# Patient Record
Sex: Male | Born: 1970 | Race: White | Hispanic: No | Smoking: Never smoker
Health system: Southern US, Community
[De-identification: ages and names within clinical notes are randomized; demographics above are authoritative.]

## PROBLEM LIST (undated history)

## (undated) DIAGNOSIS — Z87442 Personal history of urinary calculi: Secondary | ICD-10-CM

## (undated) DIAGNOSIS — T753XXA Motion sickness, initial encounter: Secondary | ICD-10-CM

## (undated) DIAGNOSIS — J45909 Unspecified asthma, uncomplicated: Secondary | ICD-10-CM

## (undated) DIAGNOSIS — Z464 Encounter for fitting and adjustment of orthodontic device: Secondary | ICD-10-CM

## (undated) DIAGNOSIS — Z973 Presence of spectacles and contact lenses: Secondary | ICD-10-CM

## (undated) DIAGNOSIS — C801 Malignant (primary) neoplasm, unspecified: Secondary | ICD-10-CM

## (undated) HISTORY — PX: NO PAST SURGERIES: SHX2092

---

## 2008-08-02 ENCOUNTER — Emergency Department: Payer: Self-pay | Admitting: Emergency Medicine

## 2013-12-31 ENCOUNTER — Encounter (HOSPITAL_COMMUNITY): Payer: Self-pay | Admitting: Emergency Medicine

## 2013-12-31 ENCOUNTER — Emergency Department (HOSPITAL_COMMUNITY)
Admission: EM | Admit: 2013-12-31 | Discharge: 2014-01-01 | Disposition: A | Discharge status: Home / Self Care | Payer: 59 | Attending: Emergency Medicine | Admitting: Emergency Medicine

## 2013-12-31 DIAGNOSIS — L255 Unspecified contact dermatitis due to plants, except food: Secondary | ICD-10-CM | POA: Insufficient documentation

## 2013-12-31 DIAGNOSIS — L237 Allergic contact dermatitis due to plants, except food: Secondary | ICD-10-CM

## 2013-12-31 MED ORDER — PREDNISONE 20 MG PO TABS: 40.0000 mg | ORAL_TABLET | Freq: Every day | ORAL | Status: DC

## 2013-12-31 MED ORDER — DEXAMETHASONE SODIUM PHOSPHATE 10 MG/ML IJ SOLN
10.0000 mg | Freq: Once | INTRAMUSCULAR | Status: AC
  Administered 2014-01-01: 10 mg via INTRAMUSCULAR
  Filled 2013-12-31: qty 1

## 2013-12-31 NOTE — ED Provider Notes (Signed)
CSN: [id number]     Arrival date & time 12/31/13  2036 History  This chart was scribed for Antonietta Breach, PA, working with Jasper Riling. Alvino Chapel, MD by Steva Colder, ED Scribe. The patient was seen in room WTR6/WTR6 at 11:47 PM.     Chief Complaint  Patient presents with  . Poison Ivy    The history is provided by the patient. No language interpreter was used.   HPI Comments: Sean Garrett is a 43 y.o. male who presents to the Emergency Department complaining of rash from a possible poison ivy exposure onset yesterday. He states that the rash is all over his face, behind his ear  and his belly button. He states that he was doing yardwork and was exposed to the poison ivy. He states that he previously cut down the poison ivy in his yard a couple of months ago. He states that he knew where it was and that he could not distinguish it from the other plants. Pt denies any other associated symptoms. He states that he has taken 1 benadryl today with no relief. He denies vision changes, vision loss, eye pain, difficulty swallowing, oral lesions, drooling, and fever.  History reviewed. No pertinent past medical history. History reviewed. No pertinent past surgical history. History reviewed. No pertinent family history. History  Substance Use Topics  . Smoking status: Never Smoker   . Smokeless tobacco: Not on file  . Alcohol Use: No    Review of Systems  Eyes: Negative for pain and visual disturbance.  Skin: Positive for rash.  All other systems reviewed and are negative.    Allergies  Review of patient's allergies indicates no known allergies.  Home Medications   Prior to Admission medications   Medication Sig Start Date End Date Taking? Authorizing Provider  predniSONE (DELTASONE) 20 MG tablet Take 2 tablets (40 mg total) by mouth daily. 12/31/13   Antonietta Breach, PA-C   BP 128/84  Pulse 74  Temp(Src) 98.7 F (37.1 C) (Oral)  Resp 20  Ht 5\' 9"  (1.753 m)  Wt 200 lb (90.719 kg)  BMI 29.52  kg/m2  SpO2 96%  Physical Exam  Nursing note and vitals reviewed. Constitutional: He is oriented to person, place, and time. He appears well-developed and well-nourished. No distress.  Nontoxic/nonseptic appearing  HENT:  Head: Normocephalic and atraumatic.  Mouth/Throat: Oropharynx is clear and moist. No oropharyngeal exudate.  Oropharynx clear. Patient tolerating secretions without difficulty. No oral lesions.  Eyes: Conjunctivae and EOM are normal. No scleral icterus.  Neck: Normal range of motion. Neck supple.  No stridor  Pulmonary/Chest: Effort normal. No stridor. No respiratory distress. He has no wheezes.  Musculoskeletal: Normal range of motion.  Neurological: He is alert and oriented to person, place, and time.  Skin: Skin is warm and dry. Rash noted. He is not diaphoretic. No erythema. No pallor.  Maculovesicular, pruritic, erythematous rash appreciated to forehead and temples b/l. No active drainage. No skin peeling.  Psychiatric: He has a normal mood and affect. His behavior is normal.    ED Course  Procedures (including critical care time) DIAGNOSTIC STUDIES: Oxygen Saturation is 96% on room air, adequate by my interpretation.    COORDINATION OF CARE: 11:52 PM-Discussed treatment plan which includes Decadron with pt at bedside and pt agreed to plan.   Labs Review Labs Reviewed - No data to display  Imaging Review No results found.   EKG Interpretation None      MDM   Final diagnoses:  Poison ivy dermatitis    Pt presentation consistant with poison ivy dermatitis. Discussed contagiousness & home care. Treated in ED with decadron. Discharge with recommendation to get OTC Zanfel. Script for PO prednisone provided. Pt afebrile and in NAD prior to discharge. Airway intact without compromise. Patient tolerating secretions without difficulty. Strict return precautions discussed. Patient agreeable to plan with no unaddressed concerns.  I personally performed the  services described in this documentation, which was scribed in my presence. The recorded information has been reviewed and is accurate.   Filed Vitals:   12/31/13 2126  BP: 128/84  Pulse: 74  Temp: 98.7 F (37.1 C)  TempSrc: Oral  Resp: 20  Height: 5\' 9"  (1.753 m)  Weight: 200 lb (90.719 kg)  SpO2: 96%     Antonietta Breach, PA-C 01/04/14 1754

## 2013-12-31 NOTE — Discharge Instructions (Signed)
Recommend prednisone as prescribed and OTC Zanfel wash. You may take Benadryl as needed for itching. Follow up with your primary care doctor. Return if symptoms worsen.  Poison [employer] ivy is a inflammation of the skin (contact dermatitis) caused by touching the allergens on the leaves of the ivy plant following previous exposure to the plant. The rash usually appears 48 hours after exposure. The rash is usually bumps (papules) or blisters (vesicles) in a linear pattern. Depending on your own sensitivity, the rash may simply cause redness and itching, or it may also progress to blisters which may break open. These must be well cared for to prevent secondary bacterial (germ) infection, followed by scarring. Keep any open areas dry, clean, dressed, and covered with an antibacterial ointment if needed. The eyes may also get puffy. The puffiness is worst in the morning and gets better as the day progresses. This dermatitis usually heals without scarring, within 2 to 3 weeks without treatment. HOME CARE INSTRUCTIONS  Thoroughly wash with soap and water as soon as you have been exposed to poison ivy. You have about one half hour to remove the plant resin before it will cause the rash. This washing will destroy the oil or antigen on the skin that is causing, or will cause, the rash. Be sure to wash under your fingernails as any plant resin there will continue to spread the rash. Do not rub skin vigorously when washing affected area. Poison ivy cannot spread if no oil from the plant remains on your body. A rash that has progressed to weeping sores will not spread the rash unless you have not washed thoroughly. It is also important to wash any clothes you have been wearing as these may carry active allergens. The rash will return if you wear the unwashed clothing, even several days later. Avoidance of the plant in the future is the best measure. Poison ivy plant can be recognized by the number of leaves. Generally,  poison ivy has three leaves with flowering branches on a single stem. Diphenhydramine may be purchased over the counter and used as needed for itching. Do not drive with this medication if it makes you drowsy.Ask your caregiver about medication for children. SEEK MEDICAL CARE IF:  Open sores develop.  Redness spreads beyond area of rash.  You notice purulent (pus-like) discharge.  You have increased pain.  Other signs of infection develop (such as fever). Document Released: 07/07/2000 Document Revised: 10/02/2011 Document Reviewed: 05/26/2009 [facility] Patient Information 2014 [place], [place].

## 2013-12-31 NOTE — ED Notes (Signed)
Pt complains of possible poison ivy since yesterday

## 2013-12-31 NOTE — ED Notes (Signed)
Initial Contact - pt A+Ox4, reports was cleaning in the yard "and now i've got poison ivy all over".  Pt reports itching to eyebrows, L side of face/ear and hands. Pt denies pain.  Red, raised areas noted.  Otherwise skin PWD.  Ambulatory with steady gait.  NAD.

## 2014-01-06 NOTE — ED Provider Notes (Signed)
Medical screening examination/treatment/procedure(s) were performed by non-physician practitioner and as supervising physician I was immediately available for consultation/collaboration.   EKG Interpretation None       Sean Garrett. Alvino Chapel, MD 01/06/14 1450

## 2014-01-22 ENCOUNTER — Emergency Department (HOSPITAL_COMMUNITY): Payer: 59

## 2014-01-22 ENCOUNTER — Emergency Department (HOSPITAL_COMMUNITY)
Admission: EM | Admit: 2014-01-22 | Discharge: 2014-01-22 | Disposition: A | Discharge status: Home / Self Care | Payer: 59 | Attending: Emergency Medicine | Admitting: Emergency Medicine

## 2014-01-22 ENCOUNTER — Encounter (HOSPITAL_COMMUNITY): Payer: Self-pay | Admitting: Emergency Medicine

## 2014-01-22 DIAGNOSIS — R1084 Generalized abdominal pain: Secondary | ICD-10-CM | POA: Insufficient documentation

## 2014-01-22 DIAGNOSIS — IMO0002 Reserved for concepts with insufficient information to code with codable children: Secondary | ICD-10-CM | POA: Insufficient documentation

## 2014-01-22 DIAGNOSIS — C642 Malignant neoplasm of left kidney, except renal pelvis: Secondary | ICD-10-CM

## 2014-01-22 DIAGNOSIS — C649 Malignant neoplasm of unspecified kidney, except renal pelvis: Secondary | ICD-10-CM | POA: Insufficient documentation

## 2014-01-22 LAB — COMPREHENSIVE METABOLIC PANEL
ALBUMIN: 4.4 g/dL (ref 3.5–5.2)
ALT: 35 U/L (ref 0–53)
AST: 45 U/L — ABNORMAL HIGH (ref 0–37)
Alkaline Phosphatase: 53 U/L (ref 39–117)
Anion gap: 15 (ref 5–15)
BILIRUBIN TOTAL: 1 mg/dL (ref 0.3–1.2)
BUN: 19 mg/dL (ref 6–23)
CHLORIDE: 100 meq/L (ref 96–112)
CO2: 25 meq/L (ref 19–32)
Calcium: 10.1 mg/dL (ref 8.4–10.5)
Creatinine, Ser: 1.06 mg/dL (ref 0.50–1.35)
GFR calc Af Amer: >90 mL/min (ref >90)
GFR, EST NON AFRICAN AMERICAN: 86 mL/min — AB (ref >90)
Glucose, Bld: 107 mg/dL — ABNORMAL HIGH (ref 70–99)
Potassium: 4 mEq/L (ref 3.7–5.3)
Sodium: 140 mEq/L (ref 137–147)
Total Protein: 8.1 g/dL (ref 6.0–8.3)

## 2014-01-22 LAB — CBC WITH DIFFERENTIAL/PLATELET
Basophils Absolute: 0 10*3/uL (ref 0.0–0.1)
Basophils Relative: 1 % (ref 0–1)
Eosinophils Absolute: 0.1 10*3/uL (ref 0.0–0.7)
Eosinophils Relative: 2 % (ref 0–5)
HEMATOCRIT: 43.4 % (ref 39.0–52.0)
HEMOGLOBIN: 15.7 g/dL (ref 13.0–17.0)
LYMPHS PCT: 49 % — AB (ref 12–46)
Lymphs Abs: 3.3 10*3/uL (ref 0.7–4.0)
MCH: 30.1 pg (ref 26.0–34.0)
MCHC: 36.2 g/dL — AB (ref 30.0–36.0)
MCV: 83.3 fL (ref 78.0–100.0)
MONO ABS: 0.4 10*3/uL (ref 0.1–1.0)
Monocytes Relative: 6 % (ref 3–12)
NEUTROS ABS: 2.9 10*3/uL (ref 1.7–7.7)
NEUTROS PCT: 42 % — AB (ref 43–77)
Platelets: 187 10*3/uL (ref 150–400)
RBC: 5.21 MIL/uL (ref 4.22–5.81)
RDW: 12.2 % (ref 11.5–15.5)
WBC: 6.7 10*3/uL (ref 4.0–10.5)

## 2014-01-22 LAB — URINALYSIS, ROUTINE W REFLEX MICROSCOPIC
Bilirubin Urine: NEGATIVE
Glucose, UA: NEGATIVE mg/dL
HGB URINE DIPSTICK: NEGATIVE
Ketones, ur: NEGATIVE mg/dL
LEUKOCYTES UA: NEGATIVE
Nitrite: NEGATIVE
PH: 7.5 (ref 5.0–8.0)
PROTEIN: NEGATIVE mg/dL
SPECIFIC GRAVITY, URINE: 1.022 (ref 1.005–1.030)
Urobilinogen, UA: 2 mg/dL — ABNORMAL HIGH (ref 0.0–1.0)

## 2014-01-22 LAB — LIPASE, BLOOD: Lipase: 13 U/L (ref 11–59)

## 2014-01-22 LAB — I-STAT TROPONIN, ED: Troponin i, poc: 0.01 ng/mL (ref 0.00–0.08)

## 2014-01-22 MED ORDER — ONDANSETRON HCL 4 MG/2ML IJ SOLN
4.0000 mg | Freq: Once | INTRAMUSCULAR | Status: AC
  Administered 2014-01-22: 4 mg via INTRAVENOUS
  Filled 2014-01-22: qty 2

## 2014-01-22 MED ORDER — MORPHINE SULFATE 4 MG/ML IJ SOLN
4.0000 mg | Freq: Once | INTRAMUSCULAR | Status: AC
  Administered 2014-01-22: 4 mg via INTRAVENOUS
  Filled 2014-01-22: qty 1

## 2014-01-22 MED ORDER — IOHEXOL 300 MG/ML  SOLN
50.0000 mL | Freq: Once | INTRAMUSCULAR | Status: AC | PRN
  Administered 2014-01-22: 50 mL via ORAL

## 2014-01-22 MED ORDER — IOHEXOL 300 MG/ML  SOLN
100.0000 mL | Freq: Once | INTRAMUSCULAR | Status: AC | PRN
  Administered 2014-01-22: 100 mL via INTRAVENOUS

## 2014-01-22 NOTE — Discharge Instructions (Signed)
Abdominal Pain Many things can cause abdominal pain. Usually, abdominal pain is not caused by a disease and will improve without treatment. It can often be observed and treated at home. Your health care provider will do a physical exam and possibly order blood tests and X-rays to help determine the seriousness of your pain. However, in many cases, more time must pass before a clear cause of the pain can be found. Before that point, your health care provider may not know if you need more testing or further treatment. HOME CARE INSTRUCTIONS  Monitor your abdominal pain for any changes. The following actions may help to alleviate any discomfort you are experiencing:  Only take over-the-counter or prescription medicines as directed by your health care provider.  Do not take laxatives unless directed to do so by your health care provider.  Try a clear liquid diet (broth, tea, or water) as directed by your health care provider. Slowly move to a bland diet as tolerated. SEEK MEDICAL CARE IF:  You have unexplained abdominal pain.  You have abdominal pain associated with nausea or diarrhea.  You have pain when you urinate or have a bowel movement.  You experience abdominal pain that wakes you in the night.  You have abdominal pain that is worsened or improved by eating food.  You have abdominal pain that is worsened with eating fatty foods.  You have a fever. SEEK IMMEDIATE MEDICAL CARE IF:   Your pain does not go away within 2 hours.  You keep throwing up (vomiting).  Your pain is felt only in portions of the abdomen, such as the right side or the left lower portion of the abdomen.  You pass bloody or black tarry stools. MAKE SURE YOU:  Understand these instructions.   Will watch your condition.   Will get help right away if you are not doing well or get worse.  Document Released: 04/19/2005 Document Revised: 07/15/2013 Document Reviewed: 03/19/2013 [facility] Patient Information  2015 [place], [place]. This information is not intended to replace advice given to you by your health care provider. Make sure you discuss any questions you have with your health care provider.  Renal Cell Cancer Renal cell cancer (kidney cancer) is a disease in which cancer cells form in the linings of tubules of the kidney. A cancer is an uncontrolled growth of cells and can occur anywhere in the body. Your kidneys are the organs which filter your blood and keep it clean by getting rid of waste products from your body in your urine. Urine passes from the kidneys into the bladder through long tubes called ureters. The bladder stores the urine until it is passed from the body through the tube which drains the bladder to the outside (urethra). SYMPTOMS  Early in the disease there may be no problems but as the disease worsens some of the problems seen are:  Blood in the urine.  Belly (abdominal) pain.  Decreased red blood cells (anemia).  A swelling in the belly.  Loss of appetite and weight loss.  Fever from unknown causes. DIAGNOSIS   Your caregiver will do a physical exam. This means they check you over.  Laboratory work may show problems (abnormalities) in the urine.  Plain X-rays and some specialized x-rays may be done. Some of these may include a CT scan. Sometimes an IVP (intravenous pyelogram) is done. In this test a dye is injected into a vein and pictures are then taken of the kidneys. The dye travels to the  inside of the kidneys, ureters and bladder. Let your caregivers know if you are allergic to iodine or have had a past reaction to dyes used in X-rays. Other specialized x-rays sometimes taken are the MRI (magnetic resonance imaging) and PET scan (positron emission technology).  Angiography is sometimes done in which a dye is put into an artery leading to the kidney so the vessels surrounding the tumor or growth can be studied.  Your caregiver will explain the value of the  various testing to you and why it is necessary and helpful. If some of the above tests show a tumor or growth, sometimes a needle biopsy is done to confirm this and find out what the growth is made of. A fine needle aspiration (FNA) is used to remove a sliver of tissue from the kidney. This is done by sticking a needle through your skin and into the kidney. A specialist in looking at cells under the microscope (pathologist) then looks at the biopsy to determine what is wrong. The pathologist will check for cancer cells. Usually the previous tests mentioned have already given your surgeon enough information to know if an operation is needed. TREATMENT  You will want to discuss treatment choices with your caregivers and see what the best treatment for you is. This will depend on various factors including your age, other health problems and what stage your disease is in. All of this will play a part in your outcome. Some of the treatment choices are:  Surgery is the main treatment and chances of surviving without this are uncommon. Usually the entire kidney is removed if this is possible. This is called a radical nephrectomy. The surgeon removes your kidney, the small gland on top of the kidney (adrenal gland) and the fat surrounding the kidney. You have another adrenal gland on the other side so removing one is not a problem. Sometimes a partial nephrectomy is done in people with one kidney or people with cancer on both sides. This may help to avoid use of an artificial kidney (dialysis) as only part of a kidney is needed to filter your blood.  Arterial embolization is another treatment. With this treatment a small catheter is threaded from your groin up into your kidney and material is injected into the artery supplying the tumor in the kidney. Without blood supply, the tumor dies off. Sometimes this procedure is used before kidney removal to cut down on blood loss.  Radiation therapy or x-ray therapy can be  used if your health is poor and will not allow surgery. Some of the problems with radiation include fatigue, nausea, vomiting, and damage to skin and surrounding tissues.  Chemotherapy may be used. This is a treatment which uses cancer killing medications to fight the cancer. The side effects depend on the medications used. Some side effects may include nausea, vomiting, loss of weight, loss of appetite, hair loss and other problems. Your caregiver can usually give you medications to overcome most of the problems.  There are many other forms of treatment your caregivers can discuss with you. Together you can determine which treatment will be best for you.  The more you know when dealing with these problems, the more comfortable you will be. Talk over your treatment over with your loved ones. Get a second opinion if you feel it will be of help. Often your surgeon and other caregivers may recommend this for your own comfort or peace of mind. Document Released: 05/20/2004 Document Revised: 10/02/2011 Document Reviewed:  04/30/2008 [employer] Patient Information 2015 [place], [place]. This information is not intended to replace advice given to you by your health care provider. Make sure you discuss any questions you have with your health care provider.

## 2014-01-22 NOTE — ED Provider Notes (Signed)
Medical screening examination/treatment/procedure(s) were conducted as a shared visit with non-physician practitioner(s) and myself.  I personally evaluated the patient during the encounter.   EKG Interpretation   Date/Time:  Thursday January 22 2014 17:55:46 EDT Ventricular Rate:  72 PR Interval:    QRS Duration: 96 QT Interval:  395 QTC Calculation: 432 R Axis:   86 Text Interpretation:  Sinus rhythm baseline artifact Confirmed by  Alvino Chapel  MD, Ilda Laskin [phone]) on 01/22/2014 5:58:43 PM     Patient with gastric abdominal pain tenderness. CT scan showed gallbladder sludge. Patient does not want ultrasound and pain is improved. Incidentally renal mass was found discussed with urology, will followup the patient.  Jasper Riling. Alvino Chapel, MD 01/22/14 2355

## 2014-01-22 NOTE — ED Provider Notes (Signed)
CSN: [id number]     Arrival date & time 01/22/14  1723 History   First MD Initiated Contact with Patient 01/22/14 1743     Chief Complaint  Patient presents with  . Abdominal Pain     (Consider location/radiation/quality/duration/timing/severity/associated sxs/prior Treatment) HPI Comments: Patient presents emergency department with chief complaint of epigastric abdominal pain that started 40 minutes ago. He denies any associated fevers, chills, nausea, vomiting, or diarrhea. States the pain is severe. It does not radiate. He reports some associated shortness of breath, but no chest pain. He has not taken anything to alleviate his symptoms. He has never felt anything like this before. States pain is worsened with lying down. Currently rates pain as 7/10.  He has a history of kidney stones, but states this feel different.  The history is provided by the patient. No language interpreter was used.    History reviewed. No pertinent past medical history. History reviewed. No pertinent past surgical history. No family history on file. History  Substance Use Topics  . Smoking status: Never Smoker   . Smokeless tobacco: Not on file  . Alcohol Use: No    Review of Systems  All other systems reviewed and are negative.     Allergies  Apple  Home Medications   Prior to Admission medications   Medication Sig Start Date End Date Taking? Authorizing Provider  predniSONE (DELTASONE) 20 MG tablet Take 2 tablets (40 mg total) by mouth daily. 12/31/13   Antonietta Breach, PA-C   BP 139/106  Pulse 61  Temp(Src) 97.7 F (36.5 C) (Oral)  Resp 24  SpO2 100% Physical Exam  Nursing note and vitals reviewed. Constitutional: He is oriented to person, place, and time. He appears well-developed and well-nourished.  HENT:  Head: Normocephalic and atraumatic.  Eyes: Conjunctivae and EOM are normal. Pupils are equal, round, and reactive to light. Right eye exhibits no discharge. Left eye exhibits no  discharge. No scleral icterus.  Neck: Normal range of motion. Neck supple. No JVD present.  Cardiovascular: Normal rate, regular rhythm and normal heart sounds.  Exam reveals no gallop and no friction rub.   No murmur heard. Pulmonary/Chest: Effort normal and breath sounds normal. No respiratory distress. He has no wheezes. He has no rales. He exhibits no tenderness.  Abdominal: Soft. He exhibits no distension and no mass. There is no tenderness. There is no rebound and no guarding.  No focal abdominal tenderness, no RLQ tenderness or pain at McBurney's point, no RUQ tenderness or Murphy's sign, no left-sided abdominal tenderness, no fluid wave, or signs of peritonitis   Musculoskeletal: Normal range of motion. He exhibits no edema and no tenderness.  Neurological: He is alert and oriented to person, place, and time.  Skin: Skin is warm and dry.  Psychiatric: He has a normal mood and affect. His behavior is normal. Judgment and thought content normal.    ED Course  Procedures (including critical care time) Results for orders placed during the hospital encounter of 01/22/14  CBC WITH DIFFERENTIAL      Result Value Ref Range   WBC 6.7  4.0 - 10.5 K/uL   RBC 5.21  4.22 - 5.81 MIL/uL   Hemoglobin 15.7  13.0 - 17.0 g/dL   HCT 43.4  39.0 - 52.0 %   MCV 83.3  78.0 - 100.0 fL   MCH 30.1  26.0 - 34.0 pg   MCHC 36.2 (*) 30.0 - 36.0 g/dL   RDW 12.2  11.5 - 15.5 %  Platelets 187  150 - 400 K/uL   Neutrophils Relative % 42 (*) 43 - 77 %   Neutro Abs 2.9  1.7 - 7.7 K/uL   Lymphocytes Relative 49 (*) 12 - 46 %   Lymphs Abs 3.3  0.7 - 4.0 K/uL   Monocytes Relative 6  3 - 12 %   Monocytes Absolute 0.4  0.1 - 1.0 K/uL   Eosinophils Relative 2  0 - 5 %   Eosinophils Absolute 0.1  0.0 - 0.7 K/uL   Basophils Relative 1  0 - 1 %   Basophils Absolute 0.0  0.0 - 0.1 K/uL  COMPREHENSIVE METABOLIC PANEL      Result Value Ref Range   Sodium 140  137 - 147 mEq/L   Potassium 4.0  3.7 - 5.3 mEq/L    Chloride 100  96 - 112 mEq/L   CO2 25  19 - 32 mEq/L   Glucose, Bld 107 (*) 70 - 99 mg/dL   BUN 19  6 - 23 mg/dL   Creatinine, Ser 1.06  0.50 - 1.35 mg/dL   Calcium 10.1  8.4 - 10.5 mg/dL   Total Protein 8.1  6.0 - 8.3 g/dL   Albumin 4.4  3.5 - 5.2 g/dL   AST 45 (*) 0 - 37 U/L   ALT 35  0 - 53 U/L   Alkaline Phosphatase 53  39 - 117 U/L   Total Bilirubin 1.0  0.3 - 1.2 mg/dL   GFR calc non Af Amer 86 (*) >90 mL/min   GFR calc Af Amer >90  >90 mL/min   Anion gap 15  5 - 15  LIPASE, BLOOD      Result Value Ref Range   Lipase 13  11 - 59 U/L  URINALYSIS, ROUTINE W REFLEX MICROSCOPIC      Result Value Ref Range   Color, Urine YELLOW  YELLOW   APPearance CLEAR  CLEAR   Specific Gravity, Urine 1.022  1.005 - 1.030   pH 7.5  5.0 - 8.0   Glucose, UA NEGATIVE  NEGATIVE mg/dL   Hgb urine dipstick NEGATIVE  NEGATIVE   Bilirubin Urine NEGATIVE  NEGATIVE   Ketones, ur NEGATIVE  NEGATIVE mg/dL   Protein, ur NEGATIVE  NEGATIVE mg/dL   Urobilinogen, UA 2.0 (*) 0.0 - 1.0 mg/dL   Nitrite NEGATIVE  NEGATIVE   Leukocytes, UA NEGATIVE  NEGATIVE  I-STAT TROPOININ, ED      Result Value Ref Range   Troponin i, poc 0.01  0.00 - 0.08 ng/mL   Comment 3            Ct Abdomen Pelvis W Contrast  01/22/2014   CLINICAL DATA:  Acute onset right upper quadrant pain.  EXAM: CT ABDOMEN AND PELVIS WITH CONTRAST  TECHNIQUE: Multidetector CT imaging of the abdomen and pelvis was performed using the standard protocol following bolus administration of intravenous contrast.  CONTRAST:  71mL OMNIPAQUE IOHEXOL 300 MG/ML SOLN, 167mL OMNIPAQUE IOHEXOL 300 MG/ML SOLN  COMPARISON:  None.  FINDINGS: LUNG BASES: Included view of the lung bases are clear. Visualized heart and pericardium are unremarkable.  SOLID ORGANS: The liver, pancreas and adrenal glands are unremarkable. Splenic granuloma, spleen is otherwise unremarkable. Pain apparent layering density in the gallbladder could reflect sludge or artifact from contrast in the  stomach.  GASTROINTESTINAL TRACT: The stomach, small and large bowel are normal in course and caliber without inflammatory changes. Normal appendix.  KIDNEYS/ URINARY TRACT: Kidneys are orthotopic, demonstrating symmetric  enhancement. Solid 2.9 cm left interpolar exophytic renal mass, with early washout. Too small to characterize hypodensity in left kidney. Delayed imaging through the kidneys demonstrates symmetric prompt excretion to the proximal urinary collecting system. Urinary bladder is partially distended and unremarkable.  PERITONEUM/RETROPERITONEUM: No intraperitoneal free fluid nor free air. Aortoiliac vessels are normal in course and caliber. No lymphadenopathy by CT size criteria. Internal reproductive organs are unremarkable.  SOFT TISSUE/OSSEOUS STRUCTURES: Nonsuspicious. Small fat containing umbilical hernia.  IMPRESSION: Possible gallbladder sludge (or artifact) which would be better assessed on sonogram as clinically indicated. No acute intra-abdominal or pelvic process.  Solid hyperenhancing 2.9 cm left renal mass highly concerning for primary renal cell carcinoma. Consider MRI versus biopsy, versus surgical consultation.  Findings discussed with and reconfirmed by Montine Circle, PA on7/2/2015at8:04 PM.   Electronically Signed   By: Elon Alas   On: 01/22/2014 20:05   Dg Chest Portable 1 View  01/22/2014   CLINICAL DATA:  Pain in xiphoid process.  EXAM: PORTABLE CHEST - 1 VIEW  COMPARISON:  None.  FINDINGS: The heart size and mediastinal contours are within normal limits. Both lungs are clear. The visualized skeletal structures are unremarkable.  IMPRESSION: No active disease.   Electronically Signed   By: Kerby Moors M.D.   On: 01/22/2014 18:22      EKG Interpretation   Date/Time:  Thursday January 22 2014 17:55:46 EDT Ventricular Rate:  72 PR Interval:    QRS Duration: 96 QT Interval:  395 QTC Calculation: 432 R Axis:   86 Text Interpretation:  Sinus rhythm baseline  artifact Confirmed by  Alvino Chapel  MD, Ovid Curd [phone]) on 01/22/2014 5:58:43 PM      MDM   Final diagnoses:  Generalized abdominal pain  Renal malignant tumor, left    Patient with right upper quadrant and started 40 minutes prior to arrival. Symptoms rapidly resolved. Patient has been pain-free almost his entire ED stay. Labs are reassuring. CT scan remarkable for possible renal cell carcinoma, however I do not suspect that this is because of the patient's symptoms. Discussed patient with urology, who will see the patient on Monday morning for evaluation and treatment of the renal cell carcinoma. No other testing at this time per urology. Patient seen by and discussed with Dr. Alvino Chapel, who agrees with the plan.    Montine Circle, PA-C 01/22/14 2102

## 2014-01-22 NOTE — ED Notes (Addendum)
PA at bedside informing pt. Of high suspicion of Renal cell carcinoma. Pt family visibly upset from the news.

## 2014-01-22 NOTE — ED Notes (Signed)
Accidental validation of 78% O2 sat. Pt maintaining in the 90's with good wave form.

## 2014-01-22 NOTE — Progress Notes (Signed)
  CARE MANAGEMENT ED NOTE 01/22/2014  Patient:  Purifoy,Terek   Account Number:  [id number]  Date Initiated:  01/22/2014  Documentation initiated by:  Livia Snellen  Subjective/Objective Assessment:   Patient presents to ED with right upper quadrant pain     Subjective/Objective Assessment Detail:     Action/Plan:   Action/Plan Detail:   Anticipated DC Date:       Status Recommendation to Physician:   Result of Recommendation:    Other ED [place]  Other  PCP issues    Choice offered to / List presented to:            Status of service:  Completed, signed off  ED Comments:   ED Comments Detail:  EDCM spoke to patient at bedside.  Patient confirms he does not have a pcp with [place] [employer] care insurance.  [facility] provided patient with a list of pcps who accept  [employer] within a ten mile radius of patient's zip code [phone].  Patient thankful for resources.  No further EDCM needs at this time.

## 2014-01-22 NOTE — ED Notes (Signed)
Patient transported to CT 

## 2014-01-22 NOTE — ED Notes (Signed)
Pt c/o RUQ pain that started sudden about 40 mins ago. Pt denies n/v/d.

## 2014-02-03 ENCOUNTER — Ambulatory Visit (HOSPITAL_COMMUNITY)
Admission: RE | Admit: 2014-02-03 | Discharge: 2014-02-03 | Disposition: A | Discharge status: Home / Self Care | Payer: 59 | Source: Ambulatory Visit | Attending: Urology | Admitting: Urology

## 2014-02-03 ENCOUNTER — Other Ambulatory Visit: Payer: Self-pay | Admitting: Urology

## 2014-02-03 DIAGNOSIS — N2889 Other specified disorders of kidney and ureter: Secondary | ICD-10-CM

## 2014-02-03 DIAGNOSIS — N289 Disorder of kidney and ureter, unspecified: Secondary | ICD-10-CM | POA: Insufficient documentation

## 2014-02-05 ENCOUNTER — Ambulatory Visit (HOSPITAL_COMMUNITY)
Admission: RE | Admit: 2014-02-05 | Discharge: 2014-02-05 | Disposition: A | Discharge status: Home / Self Care | Payer: 59 | Source: Ambulatory Visit | Attending: Urology | Admitting: Urology

## 2014-02-05 ENCOUNTER — Other Ambulatory Visit (HOSPITAL_COMMUNITY): Payer: Self-pay | Admitting: Urology

## 2014-02-05 DIAGNOSIS — N2889 Other specified disorders of kidney and ureter: Secondary | ICD-10-CM

## 2014-02-05 DIAGNOSIS — N281 Cyst of kidney, acquired: Secondary | ICD-10-CM | POA: Insufficient documentation

## 2014-02-05 DIAGNOSIS — N289 Disorder of kidney and ureter, unspecified: Secondary | ICD-10-CM | POA: Insufficient documentation

## 2014-02-05 DIAGNOSIS — K838 Other specified diseases of biliary tract: Secondary | ICD-10-CM | POA: Insufficient documentation

## 2014-02-05 DIAGNOSIS — K829 Disease of gallbladder, unspecified: Secondary | ICD-10-CM | POA: Insufficient documentation

## 2014-02-05 MED ORDER — GADOBENATE DIMEGLUMINE 529 MG/ML IV SOLN
20.0000 mL | Freq: Once | INTRAVENOUS | Status: AC | PRN
  Administered 2014-02-05: 20 mL via INTRAVENOUS

## 2014-02-06 ENCOUNTER — Other Ambulatory Visit: Payer: Self-pay | Admitting: Urology

## 2014-02-12 ENCOUNTER — Encounter (HOSPITAL_COMMUNITY): Payer: Self-pay | Admitting: Pharmacy Technician

## 2014-02-16 NOTE — Progress Notes (Addendum)
Chest x-ray 02/04/14 on EP[facility], EKG 01/23/14 on EP[facility] - will repeat, CBC with diff and CMET 01/22/14 on EPIC

## 2014-02-16 NOTE — Patient Instructions (Addendum)
[place]  02/16/2014   Your procedure is scheduled on: Thursday 02/26/14  Report to [facility] at 09:00 AM.  Call this number if you have problems the morning of surgery [phone]   Remember: please follow bowel prep instructions    Do not eat food or drink liquids After Midnight.   Do not wear jewelry, make-up or nail polish.  Do not wear lotions, powders, or perfumes. You may wear deodorant.  Do not shave 48 hours prior to surgery. Men may shave face and neck.  Do not bring valuables to the hospital.  Contacts, dentures or bridgework may not be worn into surgery.  Leave suitcase in the car. After surgery it may be brought to your room.  For patients admitted to the hospital, checkout time is 11:00 AM the day of discharge.  Paulette Blanch, RN  pre op nurse call if needed [phone]    [facility] - Preparing for Surgery Before surgery, you can play an important role.  Because skin is not sterile, your skin needs to be as free of germs as possible.  You can reduce the number of germs on your skin by washing with CHG (chlorahexidine gluconate) soap before surgery.  CHG is an antiseptic cleaner which kills germs and bonds with the skin to continue killing germs even after washing. Please DO NOT use if you have an allergy to CHG or antibacterial soaps.  If your skin becomes reddened/irritated stop using the CHG and inform your nurse when you arrive at Short Stay. Do not shave (including legs and underarms) for at least 48 hours prior to the first CHG shower.  You may shave your face/neck. Please follow these instructions carefully:  1.  Shower with CHG Soap the night before surgery and the  morning of Surgery.  2.  If you choose to wash your hair, wash your hair first as usual with your  normal  shampoo.  3.  After you shampoo, rinse your hair and body thoroughly to remove the  shampoo.                            4.  Use CHG as you would any other liquid soap.   You can apply chg directly  to the skin and wash                       Gently with a scrungie or clean washcloth.  5.  Apply the CHG Soap to your body ONLY FROM THE NECK DOWN.   Do not use on face/ open                           Wound or open sores. Avoid contact with eyes, ears mouth and genitals (private parts).                       Wash face,  Genitals (private parts) with your normal soap.             6.  Wash thoroughly, paying special attention to the area where your surgery  will be performed.  7.  Thoroughly rinse your body with warm water from the neck down.  8.  DO NOT shower/wash with your normal soap after using and rinsing off  the CHG Soap.  9.  Pat yourself dry with a clean towel.            10.  Wear clean pajamas.            11.  Place clean sheets on your bed the night of your first shower and do not  sleep with pets. Day of Surgery : Do not apply any lotions/deodorants the morning of surgery.  Please wear clean clothes to the hospital/surgery center.  FAILURE TO FOLLOW THESE INSTRUCTIONS MAY RESULT IN THE CANCELLATION OF YOUR SURGERY PATIENT SIGNATURE_________________________________  NURSE SIGNATURE__________________________________  ________________________________________________________________________   Adam Phenix  An incentive spirometer is a tool that can help keep your lungs clear and active. This tool measures how well you are filling your lungs with each breath. Taking long deep breaths may help reverse or decrease the chance of developing breathing (pulmonary) problems (especially infection) following:  A long period of time when you are unable to move or be active. BEFORE THE PROCEDURE   If the spirometer includes an indicator to show your best effort, your nurse or respiratory therapist will set it to a desired goal.  If possible, sit up straight or lean slightly forward. Try not to slouch.  Hold the incentive spirometer in an  upright position. INSTRUCTIONS FOR USE  1. Sit on the edge of your bed if possible, or sit up as far as you can in bed or on a chair. 2. Hold the incentive spirometer in an upright position. 3. Breathe out normally. 4. Place the mouthpiece in your mouth and seal your lips tightly around it. 5. Breathe in slowly and as deeply as possible, raising the piston or the ball toward the top of the column. 6. Hold your breath for 3-5 seconds or for as long as possible. Allow the piston or ball to fall to the bottom of the column. 7. Remove the mouthpiece from your mouth and breathe out normally. 8. Rest for a few seconds and repeat Steps 1 through 7 at least 10 times every 1-2 hours when you are awake. Take your time and take a few normal breaths between deep breaths. 9. The spirometer may include an indicator to show your best effort. Use the indicator as a goal to work toward during each repetition. 10. After each set of 10 deep breaths, practice coughing to be sure your lungs are clear. If you have an incision (the cut made at the time of surgery), support your incision when coughing by placing a pillow or rolled up towels firmly against it. Once you are able to get out of bed, walk around indoors and cough well. You may stop using the incentive spirometer when instructed by your caregiver.  RISKS AND COMPLICATIONS  Take your time so you do not get dizzy or light-headed.  If you are in pain, you may need to take or ask for pain medication before doing incentive spirometry. It is harder to take a deep breath if you are having pain. AFTER USE  Rest and breathe slowly and easily.  It can be helpful to keep track of a log of your progress. Your caregiver can provide you with a simple table to help with this. If you are using the spirometer at home, follow these instructions: [place] IF:   You are having difficultly using the spirometer.  You have trouble using the spirometer as often as  instructed.  Your pain medication is not giving enough relief while using the spirometer.  You  develop fever of 100.5 F (38.1 C) or higher. SEEK IMMEDIATE MEDICAL CARE IF:   You cough up bloody sputum that had not been present before.  You develop fever of 102 F (38.9 C) or greater.  You develop worsening pain at or near the incision site. MAKE SURE YOU:   Understand these instructions.  Will watch your condition.  Will get help right away if you are not doing well or get worse. Document Released: 11/20/2006 Document Revised: 10/02/2011 Document Reviewed: 01/21/2007 ExitCare Patient Information 2014 ExitCare, [place].   ________________________________________________________________________  WHAT IS A BLOOD TRANSFUSION? Blood Transfusion Information  A transfusion is the replacement of blood or some of its parts. Blood is made up of multiple cells which provide different functions.  Red blood cells carry oxygen and are used for blood loss replacement.  White blood cells fight against infection.  Platelets control bleeding.  Plasma helps clot blood.  Other blood products are available for specialized needs, such as hemophilia or other clotting disorders. BEFORE THE TRANSFUSION  Who gives blood for transfusions?   Healthy volunteers who are fully evaluated to make sure their blood is safe. This is blood bank blood. Transfusion therapy is the safest it has ever been in the practice of medicine. Before blood is taken from a donor, a complete history is taken to make sure that person has no history of diseases nor engages in risky social behavior (examples are intravenous drug use or sexual activity with multiple partners). The donor's travel history is screened to minimize risk of transmitting infections, such as malaria. The donated blood is tested for signs of infectious diseases, such as HIV and hepatitis. The blood is then tested to be sure it is compatible with you in  order to minimize the chance of a transfusion reaction. If you or a relative donates blood, this is often done in anticipation of surgery and is not appropriate for emergency situations. It takes many days to process the donated blood. RISKS AND COMPLICATIONS Although transfusion therapy is very safe and saves many lives, the main dangers of transfusion include:   Getting an infectious disease.  Developing a transfusion reaction. This is an allergic reaction to something in the blood you were given. Every precaution is taken to prevent this. The decision to have a blood transfusion has been considered carefully by your caregiver before blood is given. Blood is not given unless the benefits outweigh the risks. AFTER THE TRANSFUSION  Right after receiving a blood transfusion, you will usually feel much better and more energetic. This is especially true if your red blood cells have gotten low (anemic). The transfusion raises the level of the red blood cells which carry oxygen, and this usually causes an energy increase.  The nurse administering the transfusion will monitor you carefully for complications. HOME CARE INSTRUCTIONS  No special instructions are needed after a transfusion. You may find your energy is better. Speak with your caregiver about any limitations on activity for underlying diseases you may have. SEEK MEDICAL CARE IF:   Your condition is not improving after your transfusion.  You develop redness or irritation at the intravenous (IV) site. SEEK IMMEDIATE MEDICAL CARE IF:  Any of the following symptoms occur over the next 12 hours:  Shaking chills.  You have a temperature by mouth above 102 F (38.9 C), not controlled by medicine.  Chest, back, or muscle pain.  People around you feel you are not acting correctly or are confused.  Shortness of breath  or difficulty breathing.  Dizziness and fainting.  You get a rash or develop hives.  You have a decrease in urine  output.  Your urine turns a dark color or changes to pink, red, or brown. Any of the following symptoms occur over the next 10 days:  You have a temperature by mouth above 102 F (38.9 C), not controlled by medicine.  Shortness of breath.  Weakness after normal activity.  The white part of the eye turns yellow (jaundice).  You have a decrease in the amount of urine or are urinating less often.  Your urine turns a dark color or changes to pink, red, or brown. Document Released: 07/07/2000 Document Revised: 10/02/2011 Document Reviewed: 02/24/2008 [facility] Patient Information 2014 [place], [place].  _______________________________________________________________________

## 2014-02-17 ENCOUNTER — Encounter (HOSPITAL_COMMUNITY)
Admission: RE | Admit: 2014-02-17 | Discharge: 2014-02-17 | Disposition: A | Discharge status: Home / Self Care | Payer: 59 | Source: Ambulatory Visit | Attending: Urology | Admitting: Urology

## 2014-02-17 ENCOUNTER — Encounter (HOSPITAL_COMMUNITY): Payer: Self-pay

## 2014-02-17 DIAGNOSIS — Z01810 Encounter for preprocedural cardiovascular examination: Secondary | ICD-10-CM | POA: Insufficient documentation

## 2014-02-17 HISTORY — DX: Personal history of urinary calculi: Z87.442

## 2014-02-17 HISTORY — DX: Unspecified asthma, uncomplicated: J45.909

## 2014-02-17 HISTORY — DX: Malignant (primary) neoplasm, unspecified: C80.1

## 2014-02-25 NOTE — Progress Notes (Signed)
Surgery time change from 1100 on 02/26/14 to 0715 on 02/26/14. Called pt and left message for pt to arrive at short stay at 05:15am.

## 2014-02-25 NOTE — Progress Notes (Addendum)
0945  Call back from [place] confirming that he received the surgery time change and will arrive at [place] and go to Short Stay.

## 2014-02-25 NOTE — H&P (Signed)
Chief Complaint Left renal mass   Reason For Visit Reason for consult: To discuss minimally invasive nephron sparing surgery for his left renal mass.  Physician requesting consult: Dr. Irine Seal  PCP:   History of Present Illness Sean Garrett is a 43 year old gentleman who developed acute onset of severe epigastric abdominal pain about 2 weeks ago prompting him to present to the [facility] ED. He underwent a CT scan for evaluation and was noted to have gallbladder sludge but no clear source for his pain which has now resolved. Incidentally, he was noted to have a hyperdense left renal mass that appeared to washout on delayed imaging although his CT scan was performed only with contrast. His mass was 2.9 cm and located in the lateral interpolar region of the left kidney. It was mostly exophytic. 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 He has no contralateral renal masses, adrenal masses, regional lymphadenopathy, renal vein/IVC involvement, or other evidence of metastatic disease to the abdomen.    He denies hematuria or flank pain, denies weight loss, and has no family history of kidney cancer.     He has a history of urolithiasis and has reportedly passed 6 stones in the past. He has no stones noted on his current CT scan.    His baseline Cr is 1.06 with an eGFR of > 60 ml/min. Hgb was 15.7, LFTs and alk phos are normal except for mild elevation of AST which was 45 (0-37 normal). His UA was normal.     Past Medical History Problems  1. History of asthma (V12.69)  Surgical History Problems  1. History of Dental Surgery 2. History of Surgery Of Male Genitalia Vasectomy  Current Meds 1. No Reported Medications Recorded  Allergies Medication  1. No Known Drug Allergies  Family History Problems  1. Family history of cerebral palsy (V17.2) : Son 2. Family history of malignant neoplasm (V16.9)  Social History Problems    Denied: History of Alcohol use   Never a  smoker   Occupation   Automotive engineer  Review of Systems Genitourinary, constitutional, skin, eye, otolaryngeal, hematologic/lymphatic, cardiovascular, pulmonary, endocrine, musculoskeletal, gastrointestinal, neurological and psychiatric system(s) were reviewed and pertinent findings if present are noted.    Vitals Vital Signs [Data Includes: Last 1 Day]  Recorded: [id number] 04:49PM  Blood Pressure: 166 / 94 Heart Rate: 68  Physical Exam Constitutional: Well nourished and well developed . No acute distress.  ENT:. The ears and nose are normal in appearance.  Neck: The appearance of the neck is normal and no neck mass is present.  Pulmonary: No respiratory distress, normal respiratory rhythm and effort and clear bilateral breath sounds.  Cardiovascular: Heart rate and rhythm are normal . No peripheral edema.  Abdomen: The abdomen is soft and nontender. No masses are palpated. No CVA tenderness. No hernias are palpable. No hepatosplenomegaly noted.  Lymphatics: The supraclavicular, femoral and inguinal nodes are not enlarged or tender.  Skin: Normal skin turgor, no visible rash and no visible skin lesions.  Neuro/Psych:. Mood and affect are appropriate.    Results/Data Selected Results  UA With REFLEX [id number] 04:43PM Raynelle Bring  SPECIMEN TYPE: CLEAN CATCH   Test Name Result Flag Reference  COLOR YELLOW  YELLOW  APPEARANCE CLEAR  CLEAR  SPECIFIC GRAVITY 1.010  1.005-1.030  pH 8.5 H 5.0-8.0  GLUCOSE NEG mg/dL  NEG  BILIRUBIN NEG  NEG  KETONE NEG mg/dL  NEG  BLOOD NEG  NEG  PROTEIN NEG  mg/dL  NEG  UROBILINOGEN 1 mg/dL  0.0-1.0  NITRITE NEG  NEG  LEUKOCYTE ESTERASE NEG  NEG    I reviewed his medical records and his CT scan of the abdomen and pelvis with contrast dated 01/22/14. Findings are as dictated above.  Assessment Assessed  1. Renal mass, left (593.9)  Plan Renal mass, left  1. Follow-up Office  Follow-up - will call to schedule surgery.  Status: Hold For -  Date of  Service  Requested for: 14Jul2015 2. CHEST X-RAY; Status:Hold For - Records; Requested for:14Jul2015;  3. MRI-KIDNEYS; Status:Hold For - Appointment,PreCert,Print,Records; Requested  for:14Jul2015;   Discussion/Summary 1. Left renal mass: I reviewed his CT scan with him and his girlfriend today. I explained the need to proceed with definitive renal imaging before and after the administration of IV contrast. I also explained the need to proceed with a metastatic evaluation including appropriate chest imaging.   The patient was provided information regarding their renal mass including the relative risk of benign versus malignant pathology and the natural history of renal cell carcinoma and other possible malignancies of the kidney. The role of renal biopsy, laboratory testing, and imaging studies to further characterize renal masses and/or the presence of metastatic disease were explained. We discussed the role of active surveillance, surgical therapy with both radical nephrectomy and nephron-sparing surgery, and ablative therapy in the treatment of renal masses. In addition, we discussed our goals of providing an accurate diagnosis and oncologic control while maintaining optimal renal function as appropriate based on the size, location, and complexity of their renal mass as well as their co-morbidities.    We have discussed the risks of treatment in detail including but not limited to bleeding, infection, heart attack, stroke, death, venothromoboembolism, cancer recurrence, injury/damage to surrounding organs and structures, urine leak, the possibility of open surgical conversion for patients undergoing minimally invasive surgery, the risk of developing chronic kidney disease and its associated implications, and the potential risk of end stage renal disease possibly necessitating dialysis.     Assuming that he is confirmed to have an enhancing renal mass, I recommended that he strongly consider  a left robotic-assisted laparoscopic partial nephrectomy. We reviewed the potential risks and benefits as well as expected recovery process and the alternative options. He gives his informed consent to proceed with the above stated procedure pending his upcoming studies.    Cc: Dr. Irine Seal    SignaturesElectronically signed by : Raynelle Bring, M.D.; Feb 03 2014  5:52PM EST

## 2014-02-25 NOTE — Progress Notes (Signed)
[place] again unable to reach asked to call back and let us know that he received the message about the surgery time change.

## 2014-02-26 ENCOUNTER — Encounter (HOSPITAL_COMMUNITY)
Admission: RE | Disposition: A | Discharge status: Home / Self Care | Payer: Self-pay | Source: Ambulatory Visit | Attending: Urology

## 2014-02-26 ENCOUNTER — Encounter (HOSPITAL_COMMUNITY): Payer: 59 | Admitting: Anesthesiology

## 2014-02-26 ENCOUNTER — Encounter (HOSPITAL_COMMUNITY): Payer: Self-pay | Admitting: *Deleted

## 2014-02-26 ENCOUNTER — Inpatient Hospital Stay (HOSPITAL_COMMUNITY): Payer: 59 | Admitting: Anesthesiology

## 2014-02-26 ENCOUNTER — Inpatient Hospital Stay (HOSPITAL_COMMUNITY)
Admission: RE | Admit: 2014-02-26 | Discharge: 2014-02-28 | DRG: 658 | Disposition: A | Discharge status: Home / Self Care | Payer: 59 | Source: Ambulatory Visit | Attending: Urology | Admitting: Urology

## 2014-02-26 DIAGNOSIS — Z809 Family history of malignant neoplasm, unspecified: Secondary | ICD-10-CM | POA: Diagnosis not present

## 2014-02-26 DIAGNOSIS — R1013 Epigastric pain: Secondary | ICD-10-CM | POA: Diagnosis present

## 2014-02-26 DIAGNOSIS — Z8269 Family history of other diseases of the musculoskeletal system and connective tissue: Secondary | ICD-10-CM | POA: Diagnosis not present

## 2014-02-26 DIAGNOSIS — R110 Nausea: Secondary | ICD-10-CM | POA: Diagnosis not present

## 2014-02-26 DIAGNOSIS — Z01810 Encounter for preprocedural cardiovascular examination: Secondary | ICD-10-CM

## 2014-02-26 DIAGNOSIS — Z87442 Personal history of urinary calculi: Secondary | ICD-10-CM

## 2014-02-26 DIAGNOSIS — C649 Malignant neoplasm of unspecified kidney, except renal pelvis: Principal | ICD-10-CM | POA: Diagnosis present

## 2014-02-26 DIAGNOSIS — J45909 Unspecified asthma, uncomplicated: Secondary | ICD-10-CM | POA: Diagnosis present

## 2014-02-26 DIAGNOSIS — N289 Disorder of kidney and ureter, unspecified: Secondary | ICD-10-CM | POA: Diagnosis present

## 2014-02-26 DIAGNOSIS — N2889 Other specified disorders of kidney and ureter: Secondary | ICD-10-CM | POA: Diagnosis present

## 2014-02-26 HISTORY — PX: ROBOT ASSISTED LAPAROSCOPIC NEPHRECTOMY: SHX5140

## 2014-02-26 LAB — BASIC METABOLIC PANEL
ANION GAP: 10 (ref 5–15)
Anion gap: 9 (ref 5–15)
BUN: 17 mg/dL (ref 6–23)
BUN: 15 mg/dL (ref 6–23)
CHLORIDE: 104 meq/L (ref 96–112)
CO2: 26 mEq/L (ref 19–32)
CO2: 26 mEq/L (ref 19–32)
CREATININE: 1 mg/dL (ref 0.50–1.35)
Calcium: 9.3 mg/dL (ref 8.4–10.5)
Calcium: 8.6 mg/dL (ref 8.4–10.5)
Chloride: 101 mEq/L (ref 96–112)
Creatinine, Ser: 0.96 mg/dL (ref 0.50–1.35)
GFR calc Af Amer: >90 mL/min (ref >90)
GLUCOSE: 130 mg/dL — AB (ref 70–99)
Glucose, Bld: 97 mg/dL (ref 70–99)
POTASSIUM: 4.2 meq/L (ref 3.7–5.3)
POTASSIUM: 4.7 meq/L (ref 3.7–5.3)
SODIUM: 140 meq/L (ref 137–147)
Sodium: 136 mEq/L — ABNORMAL LOW (ref 137–147)

## 2014-02-26 LAB — TYPE AND SCREEN
ABO/RH(D): O POS
ANTIBODY SCREEN: NEGATIVE

## 2014-02-26 LAB — ABO/RH: ABO/RH(D): O POS

## 2014-02-26 LAB — CBC
HCT: 42.1 % (ref 39.0–52.0)
HEMOGLOBIN: 14.8 g/dL (ref 13.0–17.0)
MCH: 30 pg (ref 26.0–34.0)
MCHC: 35.2 g/dL (ref 30.0–36.0)
MCV: 85.4 fL (ref 78.0–100.0)
PLATELETS: 151 10*3/uL (ref 150–400)
RBC: 4.93 MIL/uL (ref 4.22–5.81)
RDW: 12.8 % (ref 11.5–15.5)
WBC: 4.6 10*3/uL (ref 4.0–10.5)

## 2014-02-26 LAB — HEMOGLOBIN AND HEMATOCRIT, BLOOD
HCT: 39.6 % (ref 39.0–52.0)
Hemoglobin: 14.1 g/dL (ref 13.0–17.0)

## 2014-02-26 SURGERY — ROBOTIC ASSISTED LAPAROSCOPIC NEPHRECTOMY
Anesthesia: General | Laterality: Left

## 2014-02-26 MED ORDER — HYDROMORPHONE HCL PF 1 MG/ML IJ SOLN
INTRAMUSCULAR | Status: DC | PRN
Start: 2014-02-26 — End: 2014-02-26
  Administered 2014-02-26 (×2): 0.5 mg via INTRAVENOUS

## 2014-02-26 MED ORDER — MIDAZOLAM HCL 2 MG/2ML IJ SOLN
INTRAMUSCULAR | Status: AC
  Filled 2014-02-26: qty 2

## 2014-02-26 MED ORDER — ONDANSETRON HCL 4 MG/2ML IJ SOLN
INTRAMUSCULAR | Status: AC
  Filled 2014-02-26: qty 2

## 2014-02-26 MED ORDER — DIPHENHYDRAMINE HCL 12.5 MG/5ML PO ELIX: 12.5000 mg | ORAL_SOLUTION | Freq: Four times a day (QID) | ORAL | Status: DC | PRN

## 2014-02-26 MED ORDER — CEFAZOLIN SODIUM-DEXTROSE 2-3 GM-% IV SOLR
2.0000 g | INTRAVENOUS | Status: AC
  Administered 2014-02-26: 2 g via INTRAVENOUS

## 2014-02-26 MED ORDER — HYDROMORPHONE HCL PF 1 MG/ML IJ SOLN
0.5000 mg | INTRAMUSCULAR | Status: DC | PRN
  Administered 2014-02-26 – 2014-02-27 (×4): 0.5 mg via INTRAVENOUS
  Filled 2014-02-26 (×4): qty 1

## 2014-02-26 MED ORDER — CEFAZOLIN SODIUM-DEXTROSE 2-3 GM-% IV SOLR
INTRAVENOUS | Status: AC
  Filled 2014-02-26: qty 50

## 2014-02-26 MED ORDER — GLYCOPYRROLATE 0.2 MG/ML IJ SOLN
INTRAMUSCULAR | Status: DC | PRN
  Administered 2014-02-26: 0.6 mg via INTRAVENOUS

## 2014-02-26 MED ORDER — CEFAZOLIN SODIUM 1-5 GM-% IV SOLN
1.0000 g | Freq: Three times a day (TID) | INTRAVENOUS | Status: AC
  Administered 2014-02-26 – 2014-02-27 (×2): 1 g via INTRAVENOUS
  Filled 2014-02-26 (×2): qty 50

## 2014-02-26 MED ORDER — HYDROCODONE-ACETAMINOPHEN 5-325 MG PO TABS: 1.0000 | ORAL_TABLET | ORAL | Status: DC | PRN

## 2014-02-26 MED ORDER — MANNITOL 25 % IV SOLN
25.0000 g | Freq: Once | INTRAVENOUS | Status: AC
  Administered 2014-02-26 (×2): 12.5 g via INTRAVENOUS
  Filled 2014-02-26: qty 100

## 2014-02-26 MED ORDER — PROPOFOL 10 MG/ML IV BOLUS
INTRAVENOUS | Status: AC
  Filled 2014-02-26: qty 20

## 2014-02-26 MED ORDER — NEOSTIGMINE METHYLSULFATE 10 MG/10ML IV SOLN
INTRAVENOUS | Status: DC | PRN
Start: 2014-02-26 — End: 2014-02-26
  Administered 2014-02-26: 4 mg via INTRAVENOUS

## 2014-02-26 MED ORDER — LACTATED RINGERS IR SOLN
Status: DC | PRN
  Administered 2014-02-26: 1000 mL

## 2014-02-26 MED ORDER — FENTANYL CITRATE 0.05 MG/ML IJ SOLN
INTRAMUSCULAR | Status: DC | PRN
  Administered 2014-02-26: 100 ug via INTRAVENOUS
  Administered 2014-02-26 (×3): 50 ug via INTRAVENOUS

## 2014-02-26 MED ORDER — HYDROMORPHONE HCL PF 2 MG/ML IJ SOLN
INTRAMUSCULAR | Status: AC
  Filled 2014-02-26: qty 1

## 2014-02-26 MED ORDER — BUPIVACAINE-EPINEPHRINE (PF) 0.25% -1:200000 IJ SOLN
INTRAMUSCULAR | Status: AC
Start: 2014-02-26 — End: 2014-02-26
  Filled 2014-02-26: qty 30

## 2014-02-26 MED ORDER — HYDROMORPHONE HCL PF 1 MG/ML IJ SOLN
INTRAMUSCULAR | Status: AC
  Filled 2014-02-26: qty 1

## 2014-02-26 MED ORDER — PROPOFOL 10 MG/ML IV BOLUS
INTRAVENOUS | Status: DC | PRN
  Administered 2014-02-26: 200 mg via INTRAVENOUS

## 2014-02-26 MED ORDER — MIDAZOLAM HCL 5 MG/5ML IJ SOLN
INTRAMUSCULAR | Status: DC | PRN
  Administered 2014-02-26: 2 mg via INTRAVENOUS

## 2014-02-26 MED ORDER — SENNA 8.6 MG PO TABS
1.0000 | ORAL_TABLET | Freq: Two times a day (BID) | ORAL | Status: DC
  Administered 2014-02-26 – 2014-02-27 (×2): 8.6 mg via ORAL
  Filled 2014-02-26 (×3): qty 1

## 2014-02-26 MED ORDER — BUPIVACAINE LIPOSOME 1.3 % IJ SUSP
20.0000 mL | Freq: Once | INTRAMUSCULAR | Status: AC
  Administered 2014-02-26: 20 mL
  Filled 2014-02-26: qty 20

## 2014-02-26 MED ORDER — STERILE WATER FOR IRRIGATION IR SOLN
Status: DC | PRN
  Administered 2014-02-26: 3000 mL

## 2014-02-26 MED ORDER — LIDOCAINE HCL (CARDIAC) 20 MG/ML IV SOLN
INTRAVENOUS | Status: AC
  Filled 2014-02-26: qty 5

## 2014-02-26 MED ORDER — LIDOCAINE HCL (CARDIAC) 20 MG/ML IV SOLN
INTRAVENOUS | Status: DC | PRN
  Administered 2014-02-26: 60 mg via INTRAVENOUS

## 2014-02-26 MED ORDER — MAGNESIUM CITRATE PO SOLN: 1.0000 | Freq: Once | ORAL | Status: AC

## 2014-02-26 MED ORDER — ACETAMINOPHEN 10 MG/ML IV SOLN
1000.0000 mg | Freq: Four times a day (QID) | INTRAVENOUS | Status: DC
  Administered 2014-02-26 (×2): 1000 mg via INTRAVENOUS
  Filled 2014-02-26 (×5): qty 100

## 2014-02-26 MED ORDER — HYDROCODONE-ACETAMINOPHEN 5-325 MG PO TABS: 1.0000 | ORAL_TABLET | Freq: Four times a day (QID) | ORAL | Status: DC | PRN

## 2014-02-26 MED ORDER — FENTANYL CITRATE 0.05 MG/ML IJ SOLN
INTRAMUSCULAR | Status: AC
  Filled 2014-02-26: qty 5

## 2014-02-26 MED ORDER — LACTATED RINGERS IV SOLN
INTRAVENOUS | Status: DC
  Administered 2014-02-26: 19:00:00 via INTRAVENOUS
  Administered 2014-02-26: 1000 mL via INTRAVENOUS
  Administered 2014-02-27 (×2): via INTRAVENOUS

## 2014-02-26 MED ORDER — EPHEDRINE SULFATE 50 MG/ML IJ SOLN
INTRAMUSCULAR | Status: DC | PRN
  Administered 2014-02-26 (×2): 5 mg via INTRAVENOUS

## 2014-02-26 MED ORDER — LACTATED RINGERS IV SOLN
INTRAVENOUS | Status: DC | PRN
  Administered 2014-02-26 (×3): via INTRAVENOUS

## 2014-02-26 MED ORDER — EPHEDRINE SULFATE 50 MG/ML IJ SOLN
INTRAMUSCULAR | Status: AC
  Filled 2014-02-26: qty 1

## 2014-02-26 MED ORDER — GLYCOPYRROLATE 0.2 MG/ML IJ SOLN
INTRAMUSCULAR | Status: AC
  Filled 2014-02-26: qty 1

## 2014-02-26 MED ORDER — HYDROMORPHONE HCL PF 1 MG/ML IJ SOLN
0.2500 mg | INTRAMUSCULAR | Status: DC | PRN
  Administered 2014-02-26: 0.5 mg via INTRAVENOUS

## 2014-02-26 MED ORDER — ACETAMINOPHEN 10 MG/ML IV SOLN
1000.0000 mg | Freq: Once | INTRAVENOUS | Status: AC
  Administered 2014-02-26: 1000 mg via INTRAVENOUS
  Filled 2014-02-26: qty 100

## 2014-02-26 MED ORDER — DEXAMETHASONE SODIUM PHOSPHATE 10 MG/ML IJ SOLN
INTRAMUSCULAR | Status: DC | PRN
  Administered 2014-02-26: 10 mg via INTRAVENOUS

## 2014-02-26 MED ORDER — DIPHENHYDRAMINE HCL 50 MG/ML IJ SOLN: 12.5000 mg | Freq: Four times a day (QID) | INTRAMUSCULAR | Status: DC | PRN

## 2014-02-26 MED ORDER — ROCURONIUM BROMIDE 100 MG/10ML IV SOLN
INTRAVENOUS | Status: AC
  Filled 2014-02-26: qty 1

## 2014-02-26 MED ORDER — ONDANSETRON HCL 4 MG/2ML IJ SOLN
4.0000 mg | INTRAMUSCULAR | Status: DC | PRN
  Administered 2014-02-26 – 2014-02-27 (×3): 4 mg via INTRAVENOUS
  Filled 2014-02-26 (×3): qty 2

## 2014-02-26 MED ORDER — ROCURONIUM BROMIDE 100 MG/10ML IV SOLN
INTRAVENOUS | Status: DC | PRN
  Administered 2014-02-26 (×2): 10 mg via INTRAVENOUS
  Administered 2014-02-26: 60 mg via INTRAVENOUS
  Administered 2014-02-26: 10 mg via INTRAVENOUS

## 2014-02-26 MED ORDER — FLEET ENEMA 7-19 GM/118ML RE ENEM: 1.0000 | ENEMA | Freq: Once | RECTAL | Status: AC

## 2014-02-26 MED ORDER — SODIUM CHLORIDE 0.9 % IJ SOLN
INTRAMUSCULAR | Status: AC
  Filled 2014-02-26: qty 10

## 2014-02-26 MED ORDER — ACETAMINOPHEN 325 MG PO TABS: 650.0000 mg | ORAL_TABLET | ORAL | Status: DC | PRN

## 2014-02-26 MED ORDER — ONDANSETRON HCL 4 MG/2ML IJ SOLN
INTRAMUSCULAR | Status: DC | PRN
  Administered 2014-02-26: 4 mg via INTRAVENOUS

## 2014-02-26 MED ORDER — SODIUM CHLORIDE 0.9 % IJ SOLN
INTRAMUSCULAR | Status: AC
  Filled 2014-02-26: qty 20

## 2014-02-26 MED ORDER — PROMETHAZINE HCL 25 MG/ML IJ SOLN: 6.2500 mg | INTRAMUSCULAR | Status: DC | PRN

## 2014-02-26 MED ORDER — DEXAMETHASONE SODIUM PHOSPHATE 10 MG/ML IJ SOLN
INTRAMUSCULAR | Status: AC
  Filled 2014-02-26: qty 1

## 2014-02-26 MED ORDER — DOCUSATE SODIUM 100 MG PO CAPS
100.0000 mg | ORAL_CAPSULE | Freq: Two times a day (BID) | ORAL | Status: DC
  Administered 2014-02-26 – 2014-02-27 (×2): 100 mg via ORAL
  Filled 2014-02-26 (×5): qty 1

## 2014-02-26 SURGICAL SUPPLY — 64 items
APPLICATOR SURGIFLO ENDO (HEMOSTASIS) ×3 IMPLANT
CABLE HIGH FREQUENCY MONO STRZ (ELECTRODE) ×3 IMPLANT
CANNULA SEAL DVNC (CANNULA) IMPLANT
CANNULA SEALS DA VINCI (CANNULA)
CHLORAPREP W/TINT 26ML (MISCELLANEOUS) ×3 IMPLANT
CLIP LIGATING HEM O LOK PURPLE (MISCELLANEOUS) IMPLANT
CLIP LIGATING HEMO O LOK GREEN (MISCELLANEOUS) ×3 IMPLANT
CLIP SUT LAPRA TY ABSORB (SUTURE) IMPLANT
CORDS BIPOLAR (ELECTRODE) ×3 IMPLANT
COVER SURGICAL LIGHT HANDLE (MISCELLANEOUS) ×3 IMPLANT
COVER TIP SHEARS 8 DVNC (MISCELLANEOUS) ×1 IMPLANT
COVER TIP SHEARS 8MM DA VINCI (MISCELLANEOUS) ×2
DECANTER SPIKE VIAL GLASS SM (MISCELLANEOUS) ×3 IMPLANT
DERMABOND ADVANCED (GAUZE/BANDAGES/DRESSINGS) ×4
DERMABOND ADVANCED .7 DNX12 (GAUZE/BANDAGES/DRESSINGS) ×2 IMPLANT
DRAIN CHANNEL 15F RND FF 3/16 (WOUND CARE) ×3 IMPLANT
DRAPE INCISE IOBAN 66X45 STRL (DRAPES) ×3 IMPLANT
DRAPE LAPAROSCOPIC ABDOMINAL (DRAPES) ×3 IMPLANT
DRAPE LG THREE QUARTER DISP (DRAPES) ×6 IMPLANT
DRAPE TABLE BACK 44X90 PK DISP (DRAPES) ×3 IMPLANT
DRAPE WARM FLUID 44X44 (DRAPE) ×3 IMPLANT
DRESSING SURGICEL FIBRLLR 1X2 (HEMOSTASIS) IMPLANT
DRSG SURGICEL FIBRILLAR 1X2 (HEMOSTASIS)
ELECT REM PT RETURN 9FT ADLT (ELECTROSURGICAL) ×3
ELECTRODE REM PT RTRN 9FT ADLT (ELECTROSURGICAL) ×1 IMPLANT
EVACUATOR SILICONE 100CC (DRAIN) ×3 IMPLANT
GLOVE BIOGEL M STRL SZ7.5 (GLOVE) ×6 IMPLANT
GOWN STRL REUS W/TWL LRG LVL3 (GOWN DISPOSABLE) ×12 IMPLANT
HEMOSTAT SURGICEL 4X8 (HEMOSTASIS) IMPLANT
KIT ACCESSORY DA VINCI DISP (KITS) ×2
KIT ACCESSORY DVNC DISP (KITS) ×1 IMPLANT
KIT BASIN OR (CUSTOM PROCEDURE TRAY) ×3 IMPLANT
NS IRRIG 1000ML POUR BTL (IV SOLUTION) IMPLANT
PENCIL BUTTON HOLSTER BLD 10FT (ELECTRODE) ×3 IMPLANT
POSITIONER SURGICAL ARM (MISCELLANEOUS) ×3 IMPLANT
POUCH SPECIMEN RETRIEVAL 10MM (ENDOMECHANICALS) IMPLANT
SET TUBE IRRIG SUCTION NO TIP (IRRIGATION / IRRIGATOR) IMPLANT
SOLUTION ANTI FOG 6CC (MISCELLANEOUS) ×3 IMPLANT
SOLUTION ELECTROLUBE (MISCELLANEOUS) ×3 IMPLANT
SPONGE LAP 18X18 X RAY DECT (DISPOSABLE) IMPLANT
SURGIFLO W/THROMBIN 8M KIT (HEMOSTASIS) ×3 IMPLANT
SUT ETHILON 3 0 PS 1 (SUTURE) ×3 IMPLANT
SUT MNCRL AB 4-0 PS2 18 (SUTURE) ×6 IMPLANT
SUT MON AB 2-0 SH 27 (SUTURE) ×3 IMPLANT
SUT MON AB 2-0 SH27 (SUTURE) IMPLANT
SUT V-LOC BARB 180 2/0GR6 GS22 (SUTURE) ×6
SUT VIC AB 0 CT1 27 (SUTURE)
SUT VIC AB 0 CT1 27XBRD ANTBC (SUTURE) IMPLANT
SUT VIC AB 0 UR5 27 (SUTURE) ×3 IMPLANT
SUT VIC AB 2-0 SH 27 (SUTURE) ×4
SUT VIC AB 2-0 SH 27X BRD (SUTURE) ×2 IMPLANT
SUT VIC AB 4-0 RB1 27 (SUTURE) ×4
SUT VIC AB 4-0 RB1 27XBRD (SUTURE) ×2 IMPLANT
SUT VICRYL 0 UR6 27IN ABS (SUTURE) ×6 IMPLANT
SUT VLOC BARB 180 ABS3/0GR12 (SUTURE) ×3
SUTURE V-LC BRB 180 2/0GR6GS22 (SUTURE) ×2 IMPLANT
SUTURE VLOC BRB 180 ABS3/0GR12 (SUTURE) ×1 IMPLANT
SYR BULB IRRIGATION 50ML (SYRINGE) IMPLANT
TOWEL OR NON WOVEN STRL DISP B (DISPOSABLE) ×3 IMPLANT
TRAY FOLEY CATH 14FRSI W/METER (CATHETERS) IMPLANT
TRAY LAP CHOLE (CUSTOM PROCEDURE TRAY) ×3 IMPLANT
TROCAR XCEL 12X100 BLDLESS (ENDOMECHANICALS) ×3 IMPLANT
TUBING INSUFFLATION 10FT LAP (TUBING) ×3 IMPLANT
WATER STERILE IRR 1500ML POUR (IV SOLUTION) IMPLANT

## 2014-02-26 NOTE — Progress Notes (Signed)
Post-op note  Subjective: The patient is doing well.  No complaints. Pain is controlled.  Objective: Vital signs in last 24 hours: Temp:  [97.6 F (36.4 C)-98.4 F (36.9 C)] 97.9 F (36.6 C) (08/06 1421) Pulse Rate:  [54-94] 56 (08/06 1421) Resp:  [18-20] 18 (08/06 1421) BP: (109-130)/(65-82) 111/65 mmHg (08/06 1421) SpO2:  [93 %-100 %] 98 % (08/06 1421) Weight:  [97.977 kg (216 lb)] 97.977 kg (216 lb) (08/06 1145)  Intake/Output from previous day:   Intake/Output this shift: Total I/O In: 3100 [I.V.:3000; IV Piggyback:100] Out: 740 [Urine:620; Drains:20; Blood:100]  Physical Exam:  General: Alert and oriented. Abdomen: Soft, Nondistended. Incisions: Clean and dry.  Lab Results:  Recent Labs  02/26/14 0555 02/26/14 1040  HGB 14.8 14.1  HCT 42.1 39.6    Assessment/Plan: POD#0   1) Continue to monitor   Pryor Curia. MD   LOS: 0 days   Montrail Mehrer,LES 02/26/2014, 3:26 PM

## 2014-02-26 NOTE — Anesthesia Postprocedure Evaluation (Signed)
  Anesthesia Post-op Note  Patient: Sean Garrett  Procedure(s) Performed: Procedure(s) with comments: ROBOTIC ASSISTED LAPAROSCOPIC PARTIAL NEPHRECTOMY (Left) - **PARTIAL NEPHRECTOMY**  Patient Location: PACU  Anesthesia Type:General  Level of Consciousness: awake, alert  and oriented  Airway and Oxygen Therapy: Patient Spontanous Breathing  Post-op Pain: none  Post-op Assessment: Post-op Vital signs reviewed  Post-op Vital Signs: Reviewed  Last Vitals:  Filed Vitals:   02/26/14 1145  BP: 122/69  Pulse: 55  Temp: 36.4 C  Resp: 20    Complications: No apparent anesthesia complications

## 2014-02-26 NOTE — Progress Notes (Signed)
Writer educated patient about how to use incentive spirometer along with the benefits of usage.  Patient verbalized understanding. Writer has attempted twice to get patient to use incentive spirometer and both times patient says he wants to wait until later time to use. Education reinforced with patient and patient again verbalized understanding and stated " I will use it later."  Will continue to monitor patient, reinforce education, and encourage use of incentive spirometer.

## 2014-02-26 NOTE — Discharge Instructions (Signed)

## 2014-02-26 NOTE — Interval H&P Note (Signed)
History and Physical Interval Note:  02/26/2014 7:09 AM  Sean Garrett  has presented today for surgery, with the diagnosis of Left Renal Mass  The various methods of treatment have been discussed with the patient and family. After consideration of risks, benefits and other options for treatment, the patient has consented to  Procedure(s) with comments: ROBOTIC ASSISTED LAPAROSCOPIC PARTIAL NEPHRECTOMY (Left) - **PARTIAL NEPHRECTOMY** as a surgical intervention .  The patient's history has been reviewed, patient examined, no change in status, stable for surgery. Questions were answered to the patient's satisfaction.     Guinevere Stephenson,LES

## 2014-02-26 NOTE — Anesthesia Preprocedure Evaluation (Addendum)
Anesthesia Evaluation  Patient identified by MRN, date of birth, ID band Patient awake    Reviewed: Allergy & Precautions, H&P , NPO status , Patient's Chart, lab work & pertinent test results  Airway Mallampati: II TM Distance: >3 FB Neck ROM: Full    Dental  (+) Teeth Intact, Dental Advisory Given   Pulmonary asthma ,  As a child. No inhaler use breath sounds clear to auscultation        Cardiovascular negative cardio ROS  Rhythm:Regular Rate:Normal     Neuro/Psych negative neurological ROS  negative psych ROS   GI/Hepatic negative GI ROS, Neg liver ROS,   Endo/Other  negative endocrine ROS  Renal/GU Left Renal mass  negative genitourinary   Musculoskeletal negative musculoskeletal ROS (+)   Abdominal   Peds  Hematology negative hematology ROS (+)   Anesthesia Other Findings   Reproductive/Obstetrics negative OB ROS                          Anesthesia Physical Anesthesia Plan  ASA: II  Anesthesia Plan: General   Post-op Pain Management:    Induction: Intravenous  Airway Management Planned: Oral ETT  Additional Equipment: None  Intra-op Plan:   Post-operative Plan: Extubation in OR  Informed Consent: I have reviewed the patients History and Physical, chart, labs and discussed the procedure including the risks, benefits and alternatives for the proposed anesthesia with the patient or authorized representative who has indicated his/her understanding and acceptance.   Dental advisory given  Plan Discussed with: CRNA and Surgeon  Anesthesia Plan Comments:         Anesthesia Quick Evaluation

## 2014-02-26 NOTE — Op Note (Signed)
Preoperative diagnosis: Left renal mass  Postoperative diagnosis: Left renal mass  Procedure:  1. Left robotic-assisted laparoscopic partial nephrectomy  Surgeon: Pryor Curia. M.D.  Assistant(s): 1. Leta Baptist, PA-C  Resident assistant: Dr. Park Liter  Anesthesia: General  Complications: None  EBL: 100 mL  IVF:  2000 mL crystalloid  Specimens: 1. Left renal mass  Disposition of specimens: Pathology  Intraoperative findings:       1. Warm renal ischemia time: 16 minutes  Drains: 1. # 15 Blake perinephric drain  Indication:  Sean Garrett is a 43 y.o. year old patient with a left renal mass.  After a thorough review of the management options for their renal mass, they elected to proceed with surgical treatment and the above procedure.  We have discussed the potential benefits and risks of the procedure, side effects of the proposed treatment, the likelihood of the patient achieving the goals of the procedure, and any potential problems that might occur during the procedure or recuperation. Informed consent has been obtained.   Description of procedure:  The patient was taken to the operating room and a general anesthetic was administered. The patient was given preoperative antibiotics, placed in the left modified flank position with care to pad all potential pressure points, and prepped and draped in the usual sterile fashion. Next a preoperative timeout was performed.  A site was selected on the left side of the umbilicus for placement of the camera port. This was placed using a standard open Hassan technique which allowed entry into the peritoneal cavity under direct vision and without difficulty. A 12 mm port was placed and a pneumoperitoneum established. The camera was then used to inspect the abdomen and there was no evidence of any intra-abdominal injuries or other abnormalities. The remaining abdominal ports were then placed. 8 mm robotic ports were  placed in the left upper quadrant, left lower quadrant, and far left lateral abdominal wall. A 12 mm port was placed in the upper midline for laparoscopic assistance. All ports were placed under direct vision without difficulty. The surgical cart was then docked.   Utilizing the cautery scissors, the white line of Toldt was incised allowing the colon to be mobilized medially and the plane between the mesocolon and the anterior layer of Gerota's fascia to be developed and the kidney to be exposed.  The ureter and gonadal vein were identified inferiorly and the ureter was lifted anteriorly off the psoas muscle.  Dissection proceeded superiorly along the gonadal vein until the renal vein was identified.  The renal hilum was then carefully isolated with a combination of blunt and sharp dissection allowing the renal arterial and venous structures to be separated and isolated in preparation for renal hilar vessel clamping. There was a single renal artery and single renal vein.  12.5 g of IV mannitol was then administered.   Attention turned to the kidney and the perinephric fat surrounding the renal mass was removed and the kidney was mobilized sufficiently for exposure and resection of the renal mass.  The tumor was easily identified on the anterior aspect of the kidney in the interpolar region of the kidney.  Once the renal mass was properly isolated, preparations were made for resection of the tumor.  Reconstructive sutures were placed into the abdomen for the renorrhaphy portion of the procedure.  The renal artery was then clamped with bulldog clamps.  The tumor was then excised with cold scissor dissection along with an adequate visible gross margin of normal  renal parenchyma. The tumor appeared to be excised without any gross violation of the tumor. The renal collecting system was not entered during removal of the tumor.  A running 3-0 V-lock suture was then brought through the capsule of the kidney and run  along the base of the renal defect to provide hemostasis and close any entry into the renal collecting system if present. Weck clips were used to secure this suture outside the renal capsule at the proximal and distal ends. An additional hemostatic agent (Surgiflo) was then placed into the renal defect. A running 2-0 V lock suture was then used to close the renal capsule using a sliding clip technique which resulted in excellent compression of the renal defect.    The bulldog clamps were then removed from the renal hilar vessel(s) and an additional 12.5 g of IV mannitol was administered. Total warm renal ischemia time was 16 minutes. The renal tumor resection site was examined. Hemostasis appeared adequate.   The kidney was placed back into its normal anatomic position and covered with perinephric fat as needed.  A # 70 Blake drain was then brought through the lateral lower port site and positioned in the perinephric space.  It was secured to the skin with a nylon suture. The surgical cart was undocked.  The renal tumor specimen was removed intact within an endopouch retrieval bag via the camera port sites.  The camera port site and the other 12 mm port site were then closed at the fascial level with 0-vicryl suture.  All other laparoscopic/robotic ports were removed under direct vision and the pneumoperitoneum let down with inspection of the operative field performed and hemostasis again confirmed. All incision sites were then injected with local anesthetic and reapproximated at the skin level with 4-0 monocryl subcuticular closures.  Dermabond was applied to the skin.  The patient tolerated the procedure well and without complications.  The patient was able to be extubated and transferred to the recovery unit in satisfactory condition.  Pryor Curia MD

## 2014-02-26 NOTE — Transfer of Care (Signed)
Immediate Anesthesia Transfer of Care Note  Patient: Sean Garrett  Procedure(s) Performed: Procedure(s) with comments: ROBOTIC ASSISTED LAPAROSCOPIC PARTIAL NEPHRECTOMY (Left) - **PARTIAL NEPHRECTOMY**  Patient Location: PACU  Anesthesia Type:General  Level of Consciousness: awake, alert  and oriented  Airway & Oxygen Therapy: Patient Spontanous Breathing and Patient connected to face mask oxygen  Post-op Assessment: Report given to PACU RN and Post -op Vital signs reviewed and stable  Post vital signs: Reviewed and stable  Complications: No apparent anesthesia complications

## 2014-02-27 ENCOUNTER — Encounter (HOSPITAL_COMMUNITY): Payer: Self-pay | Admitting: Urology

## 2014-02-27 LAB — CBC
HEMATOCRIT: 38.5 % — AB (ref 39.0–52.0)
HEMOGLOBIN: 13.5 g/dL (ref 13.0–17.0)
MCH: 29.7 pg (ref 26.0–34.0)
MCHC: 35.1 g/dL (ref 30.0–36.0)
MCV: 84.8 fL (ref 78.0–100.0)
Platelets: 171 10*3/uL (ref 150–400)
RBC: 4.54 MIL/uL (ref 4.22–5.81)
RDW: 12.6 % (ref 11.5–15.5)
WBC: 11.9 10*3/uL — AB (ref 4.0–10.5)

## 2014-02-27 LAB — BASIC METABOLIC PANEL
Anion gap: 9 (ref 5–15)
BUN: 15 mg/dL (ref 6–23)
CO2: 25 meq/L (ref 19–32)
CREATININE: 1.02 mg/dL (ref 0.50–1.35)
Calcium: 8.7 mg/dL (ref 8.4–10.5)
Chloride: 101 mEq/L (ref 96–112)
GFR calc Af Amer: >90 mL/min (ref >90)
GFR calc non Af Amer: 90 mL/min — ABNORMAL LOW (ref >90)
Glucose, Bld: 127 mg/dL — ABNORMAL HIGH (ref 70–99)
Potassium: 4.7 mEq/L (ref 3.7–5.3)
Sodium: 135 mEq/L — ABNORMAL LOW (ref 137–147)

## 2014-02-27 LAB — CREATININE, FLUID (PLEURAL, PERITONEAL, JP DRAINAGE): CREAT FL: 1 mg/dL

## 2014-02-27 MED ORDER — HYDROCODONE-ACETAMINOPHEN 5-325 MG PO TABS
1.0000 | ORAL_TABLET | ORAL | Status: DC | PRN
  Administered 2014-02-27 – 2014-02-28 (×5): 1 via ORAL
  Filled 2014-02-27 (×5): qty 1

## 2014-02-27 MED ORDER — BISACODYL 10 MG RE SUPP
10.0000 mg | Freq: Once | RECTAL | Status: AC
Start: 2014-02-27 — End: 2014-02-27
  Administered 2014-02-27: 10 mg via RECTAL
  Filled 2014-02-27: qty 1

## 2014-02-27 MED ORDER — HYDROCODONE-ACETAMINOPHEN 5-325 MG PO TABS: 1.0000 | ORAL_TABLET | Freq: Four times a day (QID) | ORAL | Status: DC | PRN

## 2014-02-27 NOTE — Progress Notes (Signed)
Pt stated he vomited x2 after receiving Ofirmev x2.pt refuses to take anymore Ofirmev.

## 2014-02-27 NOTE — Progress Notes (Signed)
CARE MANAGEMENT NOTE 02/27/2014  Patient:  Sean Garrett,Sean Garrett   Account Number:  [id number]  Date Initiated:  02/27/2014  Documentation initiated by:  [facility]  Subjective/Objective Assessment:   43 Y/O M ADMITTED W/L RENAL MASS.     Action/Plan:   FROM HOME.   Anticipated DC Date:  03/01/2014   Anticipated DC Plan:  [place]  CM consult      Choice offered to / List presented to:             Status of service:  In process, will continue to follow Medicare Important Message given?   (If response is "NO", the following Medicare IM given date fields will be blank) Date Medicare IM given:   Medicare IM given by:   Date Additional Medicare IM given:   Additional Medicare IM given by:    Discharge Disposition:    Per UR Regulation:  Reviewed for med. necessity/level of care/duration of stay  If discussed at Long Length of Stay Meetings, dates discussed:    Comments:  02/27/14 Sean Safran RN,BSN NCM [phone] POD#1 L RADICAL PARTIAL NEPHRECTOMY.NO ANTICIPATED D/C NEEDS.

## 2014-02-27 NOTE — Progress Notes (Signed)
Patient ID: Sean Garrett, male   DOB: Aug 14, 1970, 43 y.o.   MRN: [id number]  1 Day Post-Op Subjective: Pt with nausea overnight.  Appeared to be related to IV medications.  Better this morning.  Objective: Vital signs in last 24 hours: Temp:  [97.6 F (36.4 C)-98.5 F (36.9 C)] 98.5 F (36.9 C) (08/07 0622) Pulse Rate:  [54-94] 62 (08/07 0622) Resp:  [18-20] 18 (08/07 0622) BP: (109-139)/(65-82) 139/73 mmHg (08/07 0622) SpO2:  [93 %-100 %] 98 % (08/07 0622) Weight:  [97.977 kg (216 lb)] 97.977 kg (216 lb) (08/06 1145)  Intake/Output from previous day: 08/06 0701 - 08/07 0700 In: 6470 [P.O.:240; I.V.:5930; IV Piggyback:300] Out: 2340 [Urine:2170; Drains:70; Blood:100] Intake/Output this shift:    Physical Exam:  General: Alert and oriented CV: RRR Lungs: Clear Abdomen: Soft, ND, positive BS Incisions: C/D/I Ext: NT, No erythema  Lab Results:  Recent Labs  02/26/14 0555 02/26/14 1040 02/27/14 0355  HGB 14.8 14.1 13.5  HCT 42.1 39.6 38.5*   BMET  Recent Labs  02/26/14 1040 02/27/14 0355  NA 136* 135*  K 4.7 4.7  CL 101 101  CO2 26 25  GLUCOSE 130* 127*  BUN 15 15  CREATININE 1.00 1.02  CALCIUM 8.6 8.7     Studies/Results: No results found.  Assessment/Plan: POD # 1 s/p left RAL partial nephrectomy - Ambulate, IS - Advance diet - Oral pain meds - Follow renal function - D/C catheter - Dulcolax suppository   LOS: 1 day   Richad Ramsay,LES 02/27/2014, 7:48 AM

## 2014-02-27 NOTE — Progress Notes (Signed)
Patient ID: Sean Garrett, male   DOB: 1971/05/24, 43 y.o.   MRN: [id number]   Patient doing well.  No more nausea.  Tolerating diet and ambulating well.  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  His prognosis is good.

## 2014-02-28 LAB — BASIC METABOLIC PANEL
Anion gap: 10 (ref 5–15)
BUN: 15 mg/dL (ref 6–23)
CHLORIDE: 101 meq/L (ref 96–112)
CO2: 28 mEq/L (ref 19–32)
CREATININE: 1.16 mg/dL (ref 0.50–1.35)
Calcium: 8.9 mg/dL (ref 8.4–10.5)
GFR calc Af Amer: 89 mL/min — ABNORMAL LOW (ref >90)
GFR, EST NON AFRICAN AMERICAN: 77 mL/min — AB (ref >90)
GLUCOSE: 105 mg/dL — AB (ref 70–99)
POTASSIUM: 4.4 meq/L (ref 3.7–5.3)
Sodium: 139 mEq/L (ref 137–147)

## 2014-02-28 LAB — HEMOGLOBIN AND HEMATOCRIT, BLOOD
HEMATOCRIT: 39.5 % (ref 39.0–52.0)
Hemoglobin: 13.6 g/dL (ref 13.0–17.0)

## 2014-02-28 NOTE — Progress Notes (Signed)
Patient ID: Sean Garrett, male   DOB: [birth year], 43 y.o.   MRN: [id number]  2 Days Post-Op Subjective: Pt without complaints.  Tolerating regular diet. Ambulating well. Pain controlled with po pain meds.  Objective: Vital signs in last 24 hours: Temp:  [98 F (36.7 C)-99.8 F (37.7 C)] 99.8 F (37.7 C) (08/08 0459) Pulse Rate:  [72-79] 79 (08/08 0459) Resp:  [18-20] 18 (08/08 0459) BP: (117-153)/(77-90) 124/77 mmHg (08/08 0459) SpO2:  [92 %-100 %] 95 % (08/08 0459)  Intake/Output from previous day: 08/07 0701 - 08/08 0700 In: 1168.8 [P.O.:180; I.V.:988.8] Out: 2335 [Urine:2300; Drains:35] Intake/Output this shift: Total I/O In: -  Out: 900 [Urine:900]  Physical Exam:  General: Alert and oriented CV: RRR Lungs: Clear Abdomen: Soft, ND Incisions: C/D/I Ext: NT, No erythema  Lab Results:  Recent Labs  02/26/14 1040 02/27/14 0355 02/28/14 0447  HGB 14.1 13.5 13.6  HCT 39.6 38.5* 39.5   BMET  Recent Labs  02/27/14 0355 02/28/14 0447  NA 135* 139  K 4.7 4.4  CL 101 101  CO2 25 28  GLUCOSE 127* 105*  BUN 15 15  CREATININE 1.02 1.16  CALCIUM 8.7 8.9     Studies/Results: Drain Cr 1.0  Assessment/Plan: POD # 2 s/p RAL partial nephrectomy -D/C perinephric drain - D/C home   LOS: 2 days   Trygg Mantz,LES 02/28/2014, 6:50 AM

## 2014-02-28 NOTE — Discharge Summary (Signed)
  Date of admission: 02/26/2014  Date of discharge: 02/28/2014  Admission diagnosis: Left renal mass  Discharge diagnosis: Renal cell carcinoma  History and Physical: For full details, please see admission history and physical. Briefly, Sean Garrett is a 43 y.o. year old patient with a left renal mass noted inicidentally.   Hospital Course: He underwent a left RAL partial nephrectomy on 02/26/14.  His procedure was without complications.  He remained hemodynamically stable overnight.  He began ambulating, advancing his diet and transitioning to po pain meds on POD # 1. 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 His drain Cr was consistent with serum and was removed on POD#2.  He was assessed and was stable for discharge.  Laboratory values:  Recent Labs  02/26/14 1040 02/27/14 0355 02/28/14 0447  HGB 14.1 13.5 13.6  HCT 39.6 38.5* 39.5    Recent Labs  02/27/14 0355 02/28/14 0447  CREATININE 1.02 1.16    Disposition: Home  Discharge instruction: The patient was instructed to be ambulatory but told to refrain from heavy lifting, strenuous activity, or driving.  Discharge medications:    Medication List         HYDROcodone-acetaminophen 5-325 MG per tablet  Commonly known as:  NORCO  Take 1-2 tablets by mouth every 6 (six) hours as needed.        Followup:      Follow-up Information   Follow up with Dinh Ayotte,LES, MD On 03/24/2014. (10:45)    Specialty:  Urology   Contact information:   [place] [address] [phone]

## 2014-09-25 ENCOUNTER — Other Ambulatory Visit (HOSPITAL_COMMUNITY): Payer: Self-pay | Admitting: Urology

## 2014-09-25 ENCOUNTER — Ambulatory Visit (HOSPITAL_COMMUNITY)
Admission: RE | Admit: 2014-09-25 | Discharge: 2014-09-25 | Disposition: A | Discharge status: Home / Self Care | Payer: 59 | Source: Ambulatory Visit | Attending: Urology | Admitting: Urology

## 2014-09-25 DIAGNOSIS — C649 Malignant neoplasm of unspecified kidney, except renal pelvis: Secondary | ICD-10-CM

## 2015-01-03 ENCOUNTER — Emergency Department (HOSPITAL_COMMUNITY)
Admission: EM | Admit: 2015-01-03 | Discharge: 2015-01-03 | Disposition: A | Discharge status: Home / Self Care | Payer: 59 | Attending: Emergency Medicine | Admitting: Emergency Medicine

## 2015-01-03 ENCOUNTER — Encounter (HOSPITAL_COMMUNITY): Payer: Self-pay

## 2015-01-03 DIAGNOSIS — Z87442 Personal history of urinary calculi: Secondary | ICD-10-CM | POA: Insufficient documentation

## 2015-01-03 DIAGNOSIS — K088 Other specified disorders of teeth and supporting structures: Secondary | ICD-10-CM | POA: Diagnosis present

## 2015-01-03 DIAGNOSIS — K029 Dental caries, unspecified: Secondary | ICD-10-CM

## 2015-01-03 DIAGNOSIS — J45909 Unspecified asthma, uncomplicated: Secondary | ICD-10-CM | POA: Insufficient documentation

## 2015-01-03 DIAGNOSIS — Z85528 Personal history of other malignant neoplasm of kidney: Secondary | ICD-10-CM | POA: Insufficient documentation

## 2015-01-03 MED ORDER — OXYCODONE-ACETAMINOPHEN 5-325 MG PO TABS: 1.0000 | ORAL_TABLET | ORAL | Status: DC | PRN

## 2015-01-03 MED ORDER — PENICILLIN V POTASSIUM 500 MG PO TABS: 500.0000 mg | ORAL_TABLET | Freq: Three times a day (TID) | ORAL | Status: DC

## 2015-01-03 MED ORDER — OXYCODONE-ACETAMINOPHEN 5-325 MG PO TABS
2.0000 | ORAL_TABLET | Freq: Once | ORAL | Status: AC
  Administered 2015-01-03: 2 via ORAL
  Filled 2015-01-03: qty 2

## 2015-01-03 NOTE — ED Notes (Signed)
Patient states he has 2 decayed teeth in the upper left. Patient has slight swelling of the left face. Pain radiates down to the ear and left lateral neck. Patient states he has been taking Ibuprofen  with no relief.

## 2015-01-03 NOTE — ED Provider Notes (Signed)
CSN: [id number]     Arrival date & time 01/03/15  1035 History   First MD Initiated Contact with Patient 01/03/15 1056     Chief Complaint  Patient presents with  . Dental Pain     (Consider location/radiation/quality/duration/timing/severity/associated sxs/prior Treatment) HPI   Patient here with complaint of dental pain of the left upper 3 molars. He has a history of intermittent pain. He has had worsening pain over the past 2 days in those teeth with swelling of the common, significant dental decay and caries. He complains of severe pain, worse with heat and cold and air. He denies any sore throat, difficulty swallowing, fevers, chills, myalgias. His nonsmoker. No injuries to the teeth.  Past Medical History  Diagnosis Date  . Cancer     renal cell carcinoma  . Asthma as child  . History of kidney stones     x6   Past Surgical History  Procedure Laterality Date  . No past surgeries    . Robot assisted laparoscopic nephrectomy Left 02/26/2014    Procedure: ROBOTIC ASSISTED LAPAROSCOPIC PARTIAL NEPHRECTOMY;  Surgeon: Raynelle Bring, MD;  Location: [facility];  Service: Urology;  Laterality: Left;  **PARTIAL NEPHRECTOMY**   History reviewed. No pertinent family history. History  Substance Use Topics  . Smoking status: Never Smoker   . Smokeless tobacco: Never Used  . Alcohol Use: Yes     Comment: rare    Review of Systems  Constitutional: Negative.   HENT: Positive for dental problem. Negative for trouble swallowing and voice change.   Musculoskeletal: Negative for neck pain.  Skin: Negative for wound.      Allergies  Apple  Home Medications   Prior to Admission medications   Medication Sig Start Date End Date Taking? Authorizing Provider  HYDROcodone-acetaminophen (NORCO) 5-325 MG per tablet Take 1-2 tablets by mouth every 6 (six) hours as needed. 02/26/14   Amanda Dancy, PA-C   BP 141/92 mmHg  Pulse 60  Temp(Src) 98 F (36.7 C) (Oral)  Resp 16  Ht 5\' 9"  (1.753 m)   Wt 215 lb (97.523 kg)  BMI 31.74 kg/m2  SpO2 100% Physical Exam Physical Exam  Nursing note and vitals reviewed. Constitutional: He appears well-developed and well-nourished. He appears very uncomfortable HENT:  Head: Normocephalic and atraumatic.  Eyes: Conjunctivae normal are normal. No scleral icterus.  Neck: Normal range of motion. Neck supple.  Mouth: Significant DKA in the upper first, second and third molars. There is some recession of the gumline, swelling of the gingiva surrounding beats teeth. Induration without overt abscess. Exquisitely tender to palpation. No trismus, uvula midline. Cardiovascular: Normal rate, regular rhythm and normal heart sounds.   Pulmonary/Chest: Effort normal and breath sounds normal. No respiratory distress.  Abdominal: Soft. There is no tenderness.  Musculoskeletal: He exhibits no edema.  Neurological: He is alert.  Skin: Skin is warm and dry. He is not diaphoretic.  Psychiatric: His behavior is normal.    ED Course  Procedures (including critical care time) Labs Review Labs Reviewed - No data to display  Imaging Review No results found.   EKG Interpretation None      MDM   Final diagnoses:  Pain due to dental caries    Patient with toothache.  No gross abscess.  Exam unconcerning for Ludwig's angina or spread of infection.  Will treat with penicillin and pain medicine.  Urged patient to follow-up with dentist.       Margarita Mail, PA-C 01/03/15 [place],  MD 01/04/15 1544

## 2015-01-03 NOTE — Discharge Instructions (Signed)
You have been diagnosed with Dental pain. Please call the follow up dentist first thing in the morning on Monday for a follow up appointment. Keep your discharge paperwork from today's visit to bring to the dentist office. You may also use the resource guide listed below to help you find a dentist if you do not already have one to followup with. It is very important that you get evaluated by a dentist as soon as possible.  Use your pain medication as prescribed and do not operate heavy machinery while on pain medication. Note that your pain medication contains acetaminophen (Tylenol) & its is not reccommended that you use additional acetaminophen (Tylenol) while taking this medication. Take your full course of antibiotics. Read the instructions below.  Eat a soft or liquid diet and rinse your mouth out after meals with warm water. You should see a dentist or return here at once if you have increased swelling, increased pain or uncontrolled bleeding from the site of your injury.   SEEK MEDICAL CARE IF:   You have increased pain not controlled with medicines.   You have swelling around your tooth, in your face or neck.   You have bleeding which starts, continues, or gets worse.   You have a fever >101  If you are unable to open your mouth Soft Diet  The soft diet may be recommended after you were put on a full liquid diet. A normal diet may follow. The soft diet can also be used after surgery if you are too ill to keep down a normal diet. The soft diet may also be needed if you have a hard time chewing foods.  DESCRIPTION  Tender foods are used. Foods do not need to be ground or pureed. Most raw fruits and vegetables and coarse breads and cereals should be avoided. Fried foods and highly seasoned foods may cause discomfort.  NUTRITIONAL ADEQUACY  A healthy diet is possible if foods from each of the basic food groups are eaten daily.  SOFT DIET FOOD LISTS  Milk/Dairy  Allowed: Milk and milk  drinks, milk shakes, cream cheese, cottage cheese, mild cheeses.  Avoid: Sharp or highly seasoned cheese. Meat/Meat Substitutes  Allowed: Broiled, roasted, baked, or stewed tender lean beef, mutton, lamb, veal, chicken, [place], liver, ham, crisp bacon, white fish, tuna, salmon. Eggs, smooth peanut butter.  Avoid: All fried meats, fish, or fowl. Rich gravies and sauces. Lunch meats, sausages, hot dogs. Meats with gristle, chunky peanut butter. Breads/Grains  Allowed: Rice, noodles, spaghetti, macaroni. Dry or cooked refined cereals, such as farina, cream of wheat, oatmeal, grits, whole-wheat cereals. Plain or toasted white or wheat blend or whole-grain breads, soda crackers or saltines, flour tortillas.  Avoid: Wild rice, coarse cereals, such as bran. Seed in or on breads and crackers. Bread or bread products with nuts or seeds. Fruits/Vegetables  Allowed: Fruit and vegetable juices, well-cooked or canned fruits and vegetables, any dried fruit. One citrus fruit daily, 1 vitamin A source daily. Well-ripened, easy to chew fruits, sweet potatoes. Baked, boiled, mashed, creamed, scalloped, or au gratin potatoes. Broths or creamed soups made with allowed vegetables, strained tomatoes.  Avoid: All gas-forming vegetables (corn, radishes, Brussels sprouts, onions, broccoli, cabbage, parsnips, turnips, chili peppers, pinto beans, split peas, dried beans). Fruits containing seeds and skin. Potato chips and corn chips. All others that are not made with allowed vegetables. Highly seasoned soups. Desserts/Sweets  Allowed: Simple desserts, such as custard, junkets, gelatin desserts, plain ice cream and sherbets, simple cakes  and cookies, allowed fruits, sugar, syrup, jelly, honey, plain hard candy, and molasses.  Avoid: Rich pastries, any dessert containing dates, nuts, raisins, or coconut. Fried pastries, such as doughnuts. Chocolate. Beverages  Allowed: Fruit and vegetable juices. Caffeine-free carbonated drinks,  coffee, and tea.  Avoid: Caffeinated beverages: coffee, tea, soda or pop. Miscellaneous  Allowed: Butter, cream, margarine, mayonnaise, oil. Cream sauces, salt, and mild spices.  Avoid: Highly spiced salad dressings. Highly seasoned foods, hot sauce, mustard, horseradish, and pepper. SAMPLE MENU  Breakfast  Orange juice.  Oatmeal.  Soft cooked egg.  Toast and margarine.  2% milk.  Coffee. Lunch  Meatloaf.  Mashed potato.  Green beans.  Lemon pudding.  Bread and margarine.  Coffee. Dinner  Consomm or apricot nectar.  Chicken breast.  Rice, peas, and carrots.  Applesauce.  Bread and margarine.  2% milk. To cut the amount of fat in your diet, omit margarine and use 1% or skim milk.  NUTRIENT ANALYSIS  Calories........................1953 Kcal.  Protein.........................102 gm.  Carbohydrate...............247 gm.  Fat................................65 gm.  Cholesterol...................449 mg.  Dietary fiber.................19 gm.  Vitamin A.....................2944 RE.  Vitamin C.....................79 mg.  Niacin..........................25 mg.  Riboflavin....................2.0 mg.  Thiamin.......................1.5 mg.  Folate..........................249 mcg.  Calcium.......................1030 mg.  Phosphorus.................1782 mg.  Zinc..............................12 mg.  Iron..............................13 mg.  Sodium.........................299 mg.  Potassium....................3046 mg. Document Released: 10/17/2007 Document Revised: 10/02/2011 Document Reviewed: 10/17/2007  [facility] Patient Information 2014 [place], [place].   RESOURCE GUIDE   Dental Problems  Dr. Alexis Goodell $200 dollar visit [address] [place], [place] [address]  [phone]    Patients with Medicaid: [place] [phone] [address] Phone:   [phone]                                                  Phone:  [phone]  If unable to pay or uninsured, contact:  [facility] or [facility]. to become qualified for the adult dental clinic.  Chronic Pain Problems Contact [facility]  [phone] Patients need to be referred by their primary care doctor.  Insufficient Money for Medicine Contact [employer]:  call "211" or [place] [phone].  No Primary Care Doctor Call [facility]  [phone] Other agencies that provide inexpensive medical care    [place]  [phone]    [facility]  [place]  [phone]    [facility]  [phone]    [facility]  [place]  [place]  [phone] [place]   [phone] (emergency services [phone])  Substance Abuse Resources [facility]  [phone] [facility] [phone] [facility] [place] [phone] [place]inita Pester [phone] Residential & Outpatient Substance Abuse Program  [phone]  Abuse/Neglect [place] [phone] [place] [phone] (After Hours)  Emergency [place]  [employer] [phone]  [place] at the [place] [phone] [place] [phone]  MRSA Hotline #:   435-173-1443    [facility] [place] Dept. [address]      [place] Phone:  [phone]                                   Phone:  [phone]                 Phone:   [place] Phone:  [place] [phone] (After Hours)

## 2020-07-24 HISTORY — PX: TOOTH EXTRACTION: SUR596

## 2020-08-16 ENCOUNTER — Other Ambulatory Visit: Payer: Self-pay | Admitting: Sports Medicine

## 2020-08-16 DIAGNOSIS — R29898 Other symptoms and signs involving the musculoskeletal system: Secondary | ICD-10-CM

## 2020-08-16 DIAGNOSIS — G8929 Other chronic pain: Secondary | ICD-10-CM

## 2020-08-16 DIAGNOSIS — S4991XA Unspecified injury of right shoulder and upper arm, initial encounter: Secondary | ICD-10-CM

## 2020-08-16 DIAGNOSIS — M25511 Pain in right shoulder: Secondary | ICD-10-CM

## 2020-08-27 ENCOUNTER — Other Ambulatory Visit: Payer: Self-pay

## 2020-08-27 ENCOUNTER — Ambulatory Visit
Admission: RE | Admit: 2020-08-27 | Discharge: 2020-08-27 | Disposition: A | Discharge status: Home / Self Care | Payer: 59 | Source: Ambulatory Visit | Attending: Sports Medicine | Admitting: Sports Medicine

## 2020-08-27 DIAGNOSIS — G8929 Other chronic pain: Secondary | ICD-10-CM

## 2020-08-27 DIAGNOSIS — Y939 Activity, unspecified: Secondary | ICD-10-CM | POA: Diagnosis not present

## 2020-08-27 DIAGNOSIS — M25511 Pain in right shoulder: Secondary | ICD-10-CM | POA: Diagnosis not present

## 2020-08-27 DIAGNOSIS — S4991XA Unspecified injury of right shoulder and upper arm, initial encounter: Secondary | ICD-10-CM | POA: Diagnosis not present

## 2020-08-27 DIAGNOSIS — R29898 Other symptoms and signs involving the musculoskeletal system: Secondary | ICD-10-CM | POA: Diagnosis not present

## 2020-08-27 DIAGNOSIS — Y929 Unspecified place or not applicable: Secondary | ICD-10-CM | POA: Insufficient documentation

## 2020-08-27 MED ORDER — IOHEXOL 180 MG/ML  SOLN
15.0000 mL | Freq: Once | INTRAMUSCULAR | Status: AC | PRN
  Administered 2020-08-27: 15 mL

## 2020-08-27 MED ORDER — SODIUM CHLORIDE (PF) 0.9 % IJ SOLN
5.0000 mL | Freq: Once | INTRAMUSCULAR | Status: AC
  Administered 2020-08-27: 5 mL via INTRAVENOUS

## 2020-08-27 MED ORDER — LIDOCAINE HCL (PF) 1 % IJ SOLN
5.0000 mL | Freq: Once | INTRAMUSCULAR | Status: AC
  Administered 2020-08-27: 5 mL
  Filled 2020-08-27: qty 5

## 2020-08-27 MED ORDER — GADOBUTROL 1 MMOL/ML IV SOLN
0.0500 mL | Freq: Once | INTRAVENOUS | Status: AC | PRN
  Administered 2020-08-27: 0.05 mL

## 2020-12-21 ENCOUNTER — Other Ambulatory Visit: Payer: Self-pay | Admitting: Orthopedic Surgery

## 2021-01-14 ENCOUNTER — Ambulatory Visit: Admit: 2021-01-14 | Payer: 59 | Admitting: Orthopedic Surgery

## 2021-01-14 SURGERY — ARTHROSCOPY, SHOULDER, WITH GLENOID LABRUM REPAIR
Anesthesia: Choice | Site: Shoulder | Laterality: Right

## 2021-03-08 ENCOUNTER — Other Ambulatory Visit: Payer: Self-pay | Admitting: Orthopedic Surgery

## 2021-03-17 ENCOUNTER — Encounter: Payer: Self-pay | Admitting: Orthopedic Surgery

## 2021-04-01 ENCOUNTER — Encounter
Admission: RE | Disposition: A | Discharge status: Home / Self Care | Payer: Self-pay | Source: Home / Self Care | Attending: Orthopedic Surgery

## 2021-04-01 ENCOUNTER — Encounter: Payer: Self-pay | Admitting: Orthopedic Surgery

## 2021-04-01 ENCOUNTER — Ambulatory Visit
Admission: RE | Admit: 2021-04-01 | Discharge: 2021-04-01 | Disposition: A | Discharge status: Home / Self Care | Payer: 59 | Attending: Orthopedic Surgery | Admitting: Orthopedic Surgery

## 2021-04-01 ENCOUNTER — Ambulatory Visit: Payer: 59 | Admitting: Anesthesiology

## 2021-04-01 ENCOUNTER — Other Ambulatory Visit: Payer: Self-pay

## 2021-04-01 DIAGNOSIS — Z79899 Other long term (current) drug therapy: Secondary | ICD-10-CM | POA: Diagnosis not present

## 2021-04-01 DIAGNOSIS — Y9389 Activity, other specified: Secondary | ICD-10-CM | POA: Diagnosis not present

## 2021-04-01 DIAGNOSIS — Z801 Family history of malignant neoplasm of trachea, bronchus and lung: Secondary | ICD-10-CM | POA: Insufficient documentation

## 2021-04-01 DIAGNOSIS — X500XXA Overexertion from strenuous movement or load, initial encounter: Secondary | ICD-10-CM | POA: Diagnosis not present

## 2021-04-01 DIAGNOSIS — Z905 Acquired absence of kidney: Secondary | ICD-10-CM | POA: Insufficient documentation

## 2021-04-01 DIAGNOSIS — Z85528 Personal history of other malignant neoplasm of kidney: Secondary | ICD-10-CM | POA: Insufficient documentation

## 2021-04-01 DIAGNOSIS — S43431A Superior glenoid labrum lesion of right shoulder, initial encounter: Secondary | ICD-10-CM | POA: Diagnosis present

## 2021-04-01 HISTORY — DX: Motion sickness, initial encounter: T75.3XXA

## 2021-04-01 HISTORY — DX: Encounter for fitting and adjustment of orthodontic device: Z46.4

## 2021-04-01 HISTORY — PX: SHOULDER ARTHROSCOPY WITH CAPSULORRHAPHY: SHX6454

## 2021-04-01 HISTORY — DX: Presence of spectacles and contact lenses: Z97.3

## 2021-04-01 SURGERY — SHOULDER ATHROSCOPY WITH CAPSULORRHAPHY
Anesthesia: General | Site: Shoulder | Laterality: Right

## 2021-04-01 MED ORDER — OXYCODONE HCL 5 MG/5ML PO SOLN
5.0000 mg | Freq: Once | ORAL | Status: DC | PRN
Start: 2021-04-01 — End: 2021-04-01

## 2021-04-01 MED ORDER — CEFAZOLIN SODIUM-DEXTROSE 2-4 GM/100ML-% IV SOLN
2.0000 g | INTRAVENOUS | Status: AC
  Administered 2021-04-01: 2 g via INTRAVENOUS

## 2021-04-01 MED ORDER — ONDANSETRON 4 MG PO TBDP: 4.0000 mg | ORAL_TABLET | Freq: Three times a day (TID) | ORAL | 0 refills | Status: AC | PRN

## 2021-04-01 MED ORDER — LACTATED RINGERS IR SOLN
Status: DC | PRN
  Administered 2021-04-01: 18000 mL

## 2021-04-01 MED ORDER — PROPOFOL 10 MG/ML IV BOLUS
INTRAVENOUS | Status: DC | PRN
  Administered 2021-04-01: 170 mg via INTRAVENOUS

## 2021-04-01 MED ORDER — ONDANSETRON HCL 4 MG/2ML IJ SOLN
INTRAMUSCULAR | Status: DC | PRN
  Administered 2021-04-01: 4 mg via INTRAVENOUS

## 2021-04-01 MED ORDER — GLYCOPYRROLATE 0.2 MG/ML IJ SOLN
INTRAMUSCULAR | Status: DC | PRN
  Administered 2021-04-01: .1 mg via INTRAVENOUS

## 2021-04-01 MED ORDER — PROMETHAZINE HCL 25 MG/ML IJ SOLN: 6.2500 mg | INTRAMUSCULAR | Status: DC | PRN

## 2021-04-01 MED ORDER — BUPIVACAINE LIPOSOME 1.3 % IJ SUSP
INTRAMUSCULAR | Status: DC | PRN
  Administered 2021-04-01: 266 mg

## 2021-04-01 MED ORDER — BUPIVACAINE HCL (PF) 0.5 % IJ SOLN
INTRAMUSCULAR | Status: DC | PRN
  Administered 2021-04-01: 20 mL

## 2021-04-01 MED ORDER — FENTANYL CITRATE PF 50 MCG/ML IJ SOSY: 25.0000 ug | PREFILLED_SYRINGE | INTRAMUSCULAR | Status: DC | PRN

## 2021-04-01 MED ORDER — DEXAMETHASONE SODIUM PHOSPHATE 4 MG/ML IJ SOLN
INTRAMUSCULAR | Status: DC | PRN
  Administered 2021-04-01: 4 mg via INTRAVENOUS

## 2021-04-01 MED ORDER — MIDAZOLAM HCL 5 MG/5ML IJ SOLN
INTRAMUSCULAR | Status: DC | PRN
  Administered 2021-04-01: 2 mg via INTRAVENOUS

## 2021-04-01 MED ORDER — SCOPOLAMINE 1 MG/3DAYS TD PT72
1.0000 | MEDICATED_PATCH | TRANSDERMAL | Status: DC
  Administered 2021-04-01: 1.5 mg via TRANSDERMAL

## 2021-04-01 MED ORDER — LACTATED RINGERS IV SOLN
INTRAVENOUS | Status: DC | PRN
  Administered 2021-04-01: 4 mL

## 2021-04-01 MED ORDER — LIDOCAINE HCL (CARDIAC) PF 100 MG/5ML IV SOSY
PREFILLED_SYRINGE | INTRAVENOUS | Status: DC | PRN
  Administered 2021-04-01: 50 mg via INTRATRACHEAL

## 2021-04-01 MED ORDER — LACTATED RINGERS IV SOLN: INTRAVENOUS | Status: DC

## 2021-04-01 MED ORDER — OXYCODONE HCL 5 MG PO TABS: 5.0000 mg | ORAL_TABLET | ORAL | 0 refills | Status: AC | PRN

## 2021-04-01 MED ORDER — ACETAMINOPHEN 500 MG PO TABS: 1000.0000 mg | ORAL_TABLET | Freq: Three times a day (TID) | ORAL | 2 refills | Status: AC

## 2021-04-01 MED ORDER — ASPIRIN EC 325 MG PO TBEC: 325.0000 mg | DELAYED_RELEASE_TABLET | Freq: Every day | ORAL | 0 refills | Status: AC

## 2021-04-01 MED ORDER — ONDANSETRON HCL 4 MG/2ML IJ SOLN
4.0000 mg | Freq: Once | INTRAMUSCULAR | Status: AC
  Administered 2021-04-01: 4 mg via INTRAVENOUS

## 2021-04-01 MED ORDER — FENTANYL CITRATE (PF) 100 MCG/2ML IJ SOLN
INTRAMUSCULAR | Status: DC | PRN
  Administered 2021-04-01: 100 ug via INTRAVENOUS

## 2021-04-01 MED ORDER — OXYCODONE HCL 5 MG PO TABS
5.0000 mg | ORAL_TABLET | Freq: Once | ORAL | Status: DC | PRN
Start: 2021-04-01 — End: 2021-04-01

## 2021-04-01 MED ORDER — MEPERIDINE HCL 25 MG/ML IJ SOLN: 6.2500 mg | INTRAMUSCULAR | Status: DC | PRN

## 2021-04-01 SURGICAL SUPPLY — 58 items
ADAPTER IRRIG TUBE 2 SPIKE SOL (ADAPTER) ×4 IMPLANT
ADH SKN CLS APL DERMABOND .7 (GAUZE/BANDAGES/DRESSINGS)
ADPR TBG 2 SPK PMP STRL ASCP (ADAPTER) ×2
ANCH SUT SHRT 12.5 CANN EYLT (Anchor) ×1 IMPLANT
ANCHOR SUT 1.8 FBRTK KNTLS 2SU (Anchor) ×3 IMPLANT
ANCHOR SUT BIOCOMP LK 2.9X12.5 (Anchor) ×1 IMPLANT
APL PRP STRL LF DISP 70% ISPRP (MISCELLANEOUS) ×1
BLADE SHAVER 4.5X7 STR FR (MISCELLANEOUS) ×1 IMPLANT
CANNULA PART THRD DISP 5.75X7 (CANNULA) ×1 IMPLANT
CANNULA PARTIAL THREAD 2X7 (CANNULA) ×1 IMPLANT
CHLORAPREP W/TINT 26 (MISCELLANEOUS) ×2 IMPLANT
COOLER POLAR GLACIER W/PUMP (MISCELLANEOUS) ×2 IMPLANT
COVER LIGHT HANDLE UNIVERSAL (MISCELLANEOUS) ×4 IMPLANT
DERMABOND ADVANCED (GAUZE/BANDAGES/DRESSINGS)
DERMABOND ADVANCED .7 DNX12 (GAUZE/BANDAGES/DRESSINGS) IMPLANT
DRAPE IMP U-DRAPE 54X76 (DRAPES) ×4 IMPLANT
DRAPE INCISE IOBAN 66X45 STRL (DRAPES) ×2 IMPLANT
DRAPE U-SHAPE 48X52 POLY STRL (PACKS) ×2 IMPLANT
DRSG TEGADERM 4X4.75 (GAUZE/BANDAGES/DRESSINGS) ×8 IMPLANT
ELECT REM PT RETURN 9FT ADLT (ELECTROSURGICAL) ×2
ELECTRODE REM PT RTRN 9FT ADLT (ELECTROSURGICAL) IMPLANT
GAUZE XEROFORM 1X8 LF (GAUZE/BANDAGES/DRESSINGS) ×2 IMPLANT
GLOVE SURG ENC MOIS LTX SZ7.5 (GLOVE) ×5 IMPLANT
GLOVE SURG NEOPR MICRO LF SZ8 (GLOVE) ×3 IMPLANT
GOWN STRL REIN 2XL XLG LVL4 (GOWN DISPOSABLE) ×2 IMPLANT
GOWN STRL REUS W/ TWL LRG LVL3 (GOWN DISPOSABLE) ×2 IMPLANT
GOWN STRL REUS W/TWL LRG LVL3 (GOWN DISPOSABLE) ×4
IV LACTATED RINGER IRRG 3000ML (IV SOLUTION) ×12
IV LR IRRIG 3000ML ARTHROMATIC (IV SOLUTION) ×8 IMPLANT
KIT INSERTION 2.9 PUSHLOCK (KITS) ×1 IMPLANT
KIT STABILIZATION SHOULDER (MISCELLANEOUS) ×2 IMPLANT
KIT STR SPEAR 1.8 FBRTK DISP (KITS) ×1 IMPLANT
KIT TURNOVER KIT A (KITS) ×2 IMPLANT
LASSO 25 DEG RIGHT QUICKPASS (SUTURE) ×1 IMPLANT
MANIFOLD NEPTUNE II (INSTRUMENTS) ×2 IMPLANT
MASK FACE SPIDER DISP (MASK) ×2 IMPLANT
MAT GRAY ABSORB FLUID 28X50 (MISCELLANEOUS) ×4 IMPLANT
NDL SAFETY ECLIPSE 18X1.5 (NEEDLE) ×1 IMPLANT
NEEDLE HYPO 18GX1.5 SHARP (NEEDLE) ×2
PACK ARTHROSCOPY SHOULDER (MISCELLANEOUS) ×2 IMPLANT
PAD WRAPON POLAR SHDR XLG (MISCELLANEOUS) ×1 IMPLANT
PENCIL SMOKE EVACUATOR (MISCELLANEOUS) IMPLANT
SET TUBE SUCT SHAVER OUTFL 24K (TUBING) ×2 IMPLANT
SPONGE GAUZE 2X2 8PLY STRL LF (GAUZE/BANDAGES/DRESSINGS) ×8 IMPLANT
SUT ETHILON 3-0 FS-10 30 BLK (SUTURE)
SUT MNCRL 4-0 (SUTURE)
SUT MNCRL 4-0 27XMFL (SUTURE)
SUT VIC AB 0 CT1 36 (SUTURE) IMPLANT
SUT VIC AB 2-0 CT2 27 (SUTURE) IMPLANT
SUTURE EHLN 3-0 FS-10 30 BLK (SUTURE) IMPLANT
SUTURE MNCRL 4-0 27XMF (SUTURE) IMPLANT
SUTURE TAPE FIBERLINK 1.3 LOOP (SUTURE) IMPLANT
SUTURETAPE FIBERLINK 1.3 LOOP (SUTURE) ×2
SYR 10ML LL (SYRINGE) ×2 IMPLANT
TAPE MICROFOAM 4IN (TAPE) ×2 IMPLANT
TUBING ARTHRO INFLOW-ONLY STRL (TUBING) ×2 IMPLANT
WAND WEREWOLF FLOW 90D (MISCELLANEOUS) ×2 IMPLANT
WRAPON POLAR PAD SHDR XLG (MISCELLANEOUS) ×2

## 2021-04-01 NOTE — H&P (Signed)
Paper H&P to be scanned into permanent record. H&P reviewed. No significant changes noted.  

## 2021-04-01 NOTE — Transfer of Care (Signed)
Immediate Anesthesia Transfer of Care Note  Patient: Sean Garrett  Procedure(s) Performed: Right shoulder arthroscopic posterior labral repair and capsulorrhaphy with subpectoral  biceps tenodesis (Right: Shoulder)  Patient Location: PACU  Anesthesia Type: General  Level of Consciousness: awake, alert  and patient cooperative  Airway and Oxygen Therapy: Patient Spontanous Breathing and Patient connected to supplemental oxygen  Post-op Assessment: Post-op Vital signs reviewed, Patient's Cardiovascular Status Stable, Respiratory Function Stable, Patent Airway and No signs of Nausea or vomiting  Post-op Vital Signs: Reviewed and stable  Complications: No notable events documented.

## 2021-04-01 NOTE — Anesthesia Postprocedure Evaluation (Signed)
Anesthesia Post Note  Patient: Sean Garrett  Procedure(s) Performed: Right shoulder arthroscopic posterior labral repair and capsulorrhaphy with intrarticular  biceps tenodesis (Right: Shoulder)     Patient location during evaluation: PACU Anesthesia Type: General Level of consciousness: awake and alert Pain management: pain level controlled Vital Signs Assessment: post-procedure vital signs reviewed and stable Respiratory status: spontaneous breathing, nonlabored ventilation, respiratory function stable and patient connected to nasal cannula oxygen Cardiovascular status: blood pressure returned to baseline and stable Postop Assessment: no apparent nausea or vomiting Anesthetic complications: no   No notable events documented.  [name]  [name]

## 2021-04-01 NOTE — Discharge Instructions (Addendum)
Post-Op Instructions  1. Bracing: You will wear a shoulder immobilizer or sling for at least 3-4 weeks. Total time will be determined by type of surgical repair and can be discussed at 1st postop appointment. Keep arm in a neutral or externally rotated position using the pillow with the sling. Do NOT rest forearm against belly -- forearm shoulder be resting in a position pointing in front of the body.   2. Driving: No driving for 3 weeks post-op. When driving, do not wear the immobilizer.  3. Activity: No active lifting for 2 months. Wrist, hand, and elbow motion only. Avoid lifting the upper arm away from the body except for hygiene. You are permitted to bend and straighten the elbow actively. You may use your hand and wrist for typing, writing, and managing utensils (cutting food). Do not lift more than a coffee cup for 8 weeks.  When sleeping or resting, inclined positions (recliner chair or wedge pillow) and a pillow under the forearm for support may provide better comfort for up to 4 weeks.  Avoid long distance travel for 4 weeks.  Return to all activities after labral repair normally takes 6 months on average. If rehab goes very well, may be able to do most activities at 4 months, except overhead or contact sports.  4. Physical Therapy: Begins 3-4 days after surgery, and proceeds 1-2 times per week for at least 12-16 weeks.  5. Medications:  - You will be provided a prescription for narcotic pain medicine. After surgery, take 1-2 narcotic tablets every 4 hours if needed for severe pain.  - A prescription for anti-nausea medication will be provided in case the narcotic medicine causes nausea - take 1 tablet every 6 hours only if nauseated.   - Take tylenol 1000 mg every 8 hours for pain.  May stop tylenol 3 days after surgery if you are having minimal pain. - Take ASA '325mg'$ /day x 2 weeks to help prevent DVT/PE (blood clots).   If you are taking prescription medication for anxiety, depression,  insomnia, muscle spasm, chronic pain, or for attention deficit disorder, you are advised that you are at a higher risk of adverse effects with use of narcotics post-op, including narcotic addiction/dependence, depressed breathing, death. If you use non-prescribed substances: alcohol, marijuana, cocaine, heroin, methamphetamines, etc., you are at a higher risk of adverse effects with use of narcotics post-op, including narcotic addiction/dependence, depressed breathing, death. You are advised that taking > 50 morphine milligram equivalents (MME) of narcotic pain medication per day results in twice the risk of overdose or death. For your prescription provided: oxycodone 5 mg - taking more than 6 tablets per day would result in > 50 morphine milligram equivalents (MME) of narcotic pain medication. Be advised that we will prescribe narcotics short-term, for acute post-operative pain only - 3 weeks for major operations such as shoulder repair/reconstruction surgeries.   6. Post-Op Appointment:  Your first post-op appointment will be 10-14 days post-op.  7. Work or School: For most, but not all procedures, we advise staying out of work or school for at least 1 to 2 weeks in order to recover from the stress of surgery and to allow time for healing.   If you need a work or school note this can be provided.   Post-operative Brace: Apply and remove the brace you received as you were instructed to at the time of fitting and as described in detail as the brace's instructions for use indicate.  Wear the brace for  the period of time prescribed by your physician.  The brace can be cleaned with soap and water and allowed to air dry only.  Should the brace result in increased pain, decreased feeling (numbness/tingling), increased swelling or an overall worsening of your medical condition, please contact your doctor immediately.  If an emergency situation occurs as a result of wearing the brace after normal business  hours, please dial 911 and seek immediate medical attention.  Let your doctor know if you have any further questions about the brace issued to you. Refer to the shoulder sling instructions for use if you have any questions regarding the correct fit of your shoulder sling.  [place] [facility]: [phone]  Video that illustrates how to properly use a shoulder sling: "Instructions for Proper Use of an Orthopaedic Sling" [email or web address]     Interscalene Nerve Block with Exparel   For your surgery you have received an Interscalene Nerve Block with Exparel. Nerve Blocks affect many types of nerves, including nerves that control movement, pain and normal sensation.  You may experience feelings such as numbness, tingling, heaviness, weakness or the inability to move your arm or the feeling or sensation that your arm has "fallen asleep". A nerve block with Exparel can last up to 5 days.  Usually the weakness wears off first.  The tingling and heaviness usually wear off next.  Finally you may start to notice pain.  Keep in mind that this may occur in any order.  Once a nerve block starts to wear off it is usually completely gone within 60 minutes. ISNB may cause mild shortness of breath, a hoarse voice, blurry vision, unequal pupils, or drooping of the face on the same side as the nerve block.  These symptoms will usually resolve with the numbness.  Very rarely the procedure itself can cause mild seizures. If needed, your surgeon will give you a prescription for pain medication.  It will take about 60 minutes for the oral pain medication to become fully effective.  So, it is recommended that you start taking this medication before the nerve block first begins to wear off, or when you first begin to feel discomfort. Take your pain medication only as prescribed.  Pain medication can cause sedation and decrease your breathing if you take more than you need for the  level of pain that you have. Nausea is a common side effect of many pain medications.  You may want to eat something before taking your pain medicine to prevent nausea. After an Interscalene nerve block, you cannot feel pain, pressure or extremes in temperature in the effected arm.  Because your arm is numb it is at an increased risk for injury.  To decrease the possibility of injury, please practice the following:  While you are awake change the position of your arm frequently to prevent too much pressure on any one area for prolonged periods of time.  If you have a cast or tight dressing, check the color or your fingers every couple of hours.  Call your surgeon with the appearance of any discoloration (white or blue). If you are given a sling to wear before you go home, please wear it  at all times until the block has completely worn off.  Do not get up at night without your sling. Please contact [facility] or your surgeon if you do not begin to regain sensation after 7 days from the surgery.  Anesthesia may be contacted by calling the Same Day  Surgery Department, Mon. through Fri., 6 am to 4 pm at [phone].   If you experience any other problems or concerns, please contact your surgeon's office. If you experience severe or prolonged shortness of breath go to the nearest emergency department.

## 2021-04-01 NOTE — Anesthesia Procedure Notes (Signed)
Anesthesia Regional Block: Interscalene brachial plexus block   Pre-Anesthetic Checklist: , timeout performed,  Correct Patient, Correct Site, Correct Laterality,  Correct Procedure, Correct Position, site marked,  Risks and benefits discussed,  Surgical consent,  Pre-op evaluation,  At surgeon's request and post-op pain management  Laterality: Right  Prep: chloraprep       Needles:  Injection technique: Single-shot  Needle Type: Stimiplex     Needle Length: 10cm  Needle Gauge: 21     Additional Needles:   Procedures:,,,, ultrasound used (permanent image in chart),,    Narrative:  Injection made incrementally with aspirations every 5 mL.  Performed by: Personally  Anesthesiologist: Lucile Hillmann, Fredric Dine, MD  Additional Notes: Functioning IV was confirmed and monitors applied. Ultrasound guidance: relevant anatomy identified, needle position confirmed, local anesthetic spread visualized around nerve(s)., vascular puncture avoided.  Image printed for medical record.  Negative aspiration and no paresthesias; incremental administration of local anesthetic. The patient tolerated the procedure well. Vitals signes recorded in RN notes.

## 2021-04-01 NOTE — Anesthesia Procedure Notes (Signed)
Procedure Name: LMA Insertion Date/Time: 04/01/2021 9:48 AM Performed by: Mayme Genta, CRNA Pre-anesthesia Checklist: Patient identified, Emergency Drugs available, Suction available, Timeout performed and Patient being monitored Patient Re-evaluated:Patient Re-evaluated prior to induction Oxygen Delivery Method: Circle system utilized Preoxygenation: Pre-oxygenation with 100% oxygen Induction Type: IV induction LMA: LMA inserted LMA Size: 4.0 Number of attempts: 1 Placement Confirmation: positive ETCO2 and breath sounds checked- equal and bilateral Tube secured with: Tape

## 2021-04-01 NOTE — Op Note (Addendum)
DATE: 04/01/2021   PRE-OP DIAGNOSIS:  1. Right shoulder posterior labral tear 2. Right shoulder SLAP tear   POST-OP DIAGNOSIS:  1. Right shoulder posterior labral tear 2. Right shoulder SLAP tear   PROCEDURES:  1. Right shoulder arthroscopic posterior labral repair and capsulorraphy 2. Right arthroscopic biceps tenodesis 3. Right shoulder extensive glenohumeral debridement   SURGEON: Cato Mulligan, MD  ASSISTANT: Anitra Lauth, PA    ANESTHESIA: Gen + Exparel interscalene block    TOTAL IV FLUIDS: per anesthesia record   ESTIMATED BLOOD LOSS: minimal    DRAINS: None    SPECIMENS: None.    IMPLANTS:  Arthrex Knotless 1.13m FiberTak suture anchor x3 Arthrex 2.9 mm PushLock anchor x1   COMPLICATIONS: None    Indications:  Sean Garrett a 50y.o. Garrett with a history of shoulder pain for over 1 year. MRI showed SLAP tear and posterior labral tear. The patient has failed non-operative management in the form of appropriate physical therapy, medications, activity modifications, and corticosteroid injection. After discussion of risks, benefits, and alternatives to surgery, the patient elected to proceed.   Procedure Details  The patient was seen in the Holding Room. The risks, benefits, complications, treatment options, and expected outcomes were discussed with the patient. The risks and potential complications of the problem and proposed treatment were discussed. This includes but is not limited to failure to fully relieve pain, continued or recurrence of pain, infection, neurovascular compromise, complications from anesthesia, stiff shoulder, bleeding, DVT, and reoperation. The patient concurred with the proposed plan, giving informed consent. The site of surgery was properly noted/marked.   The patient was placed on the OR bed in the beach chair position using a beanbag. All bony prominences were well padded. The patient was given appropriate preoperative IV antibiotics. The upper  extremity was prescrubbed with alcohol, prepped with Chloroprep, and draped in the usual sterile fashion. A Time Out Procedure was performed confirming correct patient, procedure, and laterality.   Exam under anesthesia showed: laxity anterior 1+, posterior 2+. 160FF, 45 ER with arm at side  An 11 blade was used to make stab incisions in the posterior soft spot for the standard posterior viewing portal. A standard low inferior anterior portal was made as a working portal. I made an additional high anterolateral portal, which was used as the anterior viewing portal. We performed a thorough arthroscopic evaluation of the glenohumeral joint through both the anterior and posterior viewing portals.   Glenohumeral Joint: Articular cartilage of the humeral head and glenoid: mild fraying of the glenoid and humeral head Synovium: injected and erythematous Anterior Labrum: normal Posterior Labrum: posterior labral tear extending from superior to ~8 o'clock position Superior Labrum: Type 2 SLAP tear and degenerative appearance of superior labrum Inferior Labrum: normal Anterior Capsule: normal Inferior Capsule: normal  Posterior Capsule: patulous  Rotator interval: Significant synovitis Superior glenohumeral ligament: normal  Middle glenohumeral ligament: normal  Inferior glenohumeral ligament: normal. No HAGL. Biceps: Significant erythema Rotator cuff findings: normal subscapularis and infraspinatus.  Supraspinatus with approximately 10% articular sided partial-thickness tearing.  Intact bursal side.   Given the pathology of the superior labrum and biceps, decision was made to perform biceps tenodesis.  The Loop n Tack technique was used to pass a FiberTape through the biceps in a locked fashion adjacent to the biceps anchor.  A hole for a 2.9 mm Arthrex PushLock was drilled in the bicipital groove just superior to the subscapularis tendon insertion.  The biceps tendon was then  cut and the biceps anchor  complex was debrided down to a stable base on the superior labrum.  The FiberTape was loaded onto the PushLock anchor and impacted into place into the previously drilled hole in the bicipital groove.  This appropriately secured the biceps into the bicipital groove and took it off of tension.  Next, the frayed edges of the superior labrum, anterior labrum, and posterior labrum were debrided with a shaver. The injected synovium posteriorly and about the rotator interval was debrided with a combination of shaver and arthrocare wand.  The arthroscope was then placed into the subacromial space.  There was a significant amount of bursitis.  Using an oscillating shaver and ArthroCare wand, debridement of the subacromial bursa was performed.   The arthroscope was then placed in the anterolateral viewing portal. The elbow was placed in a position of slight anterior and inferior traction to allow for improved visualization. Next, a portal of [place] was made along the posterior 1/3 of the acromion just inferior to the lateral edge using a spinal needle to confirm appropriate positioning.  A 48m cannula was placed in the portal of [place]. An elevator was used to elevate the labrum off the glenoid posteriorly in the affected regions described above. A rasp and shaver were used to roughen the glenoid for improvement of healing.  We began by placing the first knotless FiberTak at the 8:30 position using a straight guide.  Arthrex suture lasso was used to shuttle the stitch through the posteroinferior capsule and labrum adjacent to the anchor.  Appropriate passing mechanism was utilized and the suture was tensioned until the capsule and labrum were appropriately reduced with formation of an appropriate bumper.  The suture tail was cut flush with the articular cartilage. This process was repeated for anchors at the 9:30 and 10:30 positions. The repair was stable to probing, there was visible evidence of decreased joint  space and capsular area with formation of an appropriate posterior bumper. The shaver was used to debride any loose bony fragments from drilling as well as the previously visualized frayed cartilage edges on the humerus and glenoid.  Fluid was evacuated from the joint and the arthroscope was removed.  The wounds were closed with 3-0 nylon. Xeroform gauze and sterile dressings were applied.  Polar Care was placed. Patient was placed in a neutral shoulder immobilizer to prevent internal rotation.  Patient was then awakened from anesthesia and transported to PACU without notable complication.    Of note, assistance from a PA was essential to performing the surgery.  PA was present for the entire surgery.  PA assisted with patient positioning, retraction, instrumentation, and wound closure. The surgery would have been more difficult and had longer operative time without PA assistance.    POSTOPERATIVE PLAN:  Patient will be discharged to home.  Physical therapy 3-4 days after surgery.  Return to the clinic 10-14 days postop for suture removal Maintain arm in immobilizer with neutral wedge. NWB. ASA 325 mg daily x2 weeks for DVT prophylaxis

## 2021-04-01 NOTE — Anesthesia Preprocedure Evaluation (Signed)
Anesthesia Evaluation  Patient identified by MRN, date of birth, ID band Patient awake    Reviewed: Allergy & Precautions, NPO status , Patient's Chart, lab work & pertinent test results, reviewed documented beta blocker date and time   History of Anesthesia Complications Negative for: history of anesthetic complications  Airway Mallampati: II  TM Distance: >3 FB Neck ROM: Full    Dental   Pulmonary asthma (Childhood) ,    breath sounds clear to auscultation       Cardiovascular (-) angina(-) DOE  Rhythm:Regular Rate:Normal     Neuro/Psych    GI/Hepatic neg GERD  ,  Endo/Other    Renal/GU Renal disease (RCC, kidney stones)     Musculoskeletal   Abdominal   Peds  Hematology   Anesthesia Other Findings Renal cell carcinoma   Reproductive/Obstetrics                            Anesthesia Physical Anesthesia Plan  ASA: 2  Anesthesia Plan: General   Post-op Pain Management:  Regional for Post-op pain   Induction: Intravenous  PONV Risk Score and Plan: 2 and Treatment may vary due to age or medical condition and Ondansetron  Airway Management Planned: LMA  Additional Equipment:   Intra-op Plan:   Post-operative Plan: Extubation in OR  Informed Consent: I have reviewed the patients History and Physical, chart, labs and discussed the procedure including the risks, benefits and alternatives for the proposed anesthesia with the patient or authorized representative who has indicated his/her understanding and acceptance.       Plan Discussed with: CRNA and Anesthesiologist  Anesthesia Plan Comments:         Anesthesia Quick Evaluation

## 2021-04-04 ENCOUNTER — Encounter: Payer: Self-pay | Admitting: Orthopedic Surgery

## 2021-08-09 IMAGING — RF DG FLUORO GUIDE NDL PLC/BX
3 series · 7 of 7 positions shown · IV contrast (multihance)
Comparison: none

CLINICAL DATA: 49-year-old male with history of chronic right-sided
shoulder pain.

EXAM:
RIGHT SHOULDER INJECTION UNDER FLUOROSCOPY
TECHNIQUE: An appropriate skin entrance site was determined. The site was
marked, prepped with Betadine, draped in the usual sterile fashion,
and infiltrated locally with buffered Lidocaine. 22 gauge spinal
needle was advanced to the superomedial margin of the humeral head
under intermittent fluoroscopy. 1 ml of Lidocaine injected easily. A
mixture of 0.05 ml Multihance 10 ml of Omnipaque 300 was then used
to opacify the right shoulder capsule. No immediate complication.
FLUOROSCOPY TIME:  Fluoroscopy Time:  2.0 minutes
Radiation Exposure Index (if provided by the fluoroscopic device):
7.5 mGy

[Series 1: cp_standard · 0.18mm/px · 1 of 1 slices shown (1 of 2)]
[im 1/1]
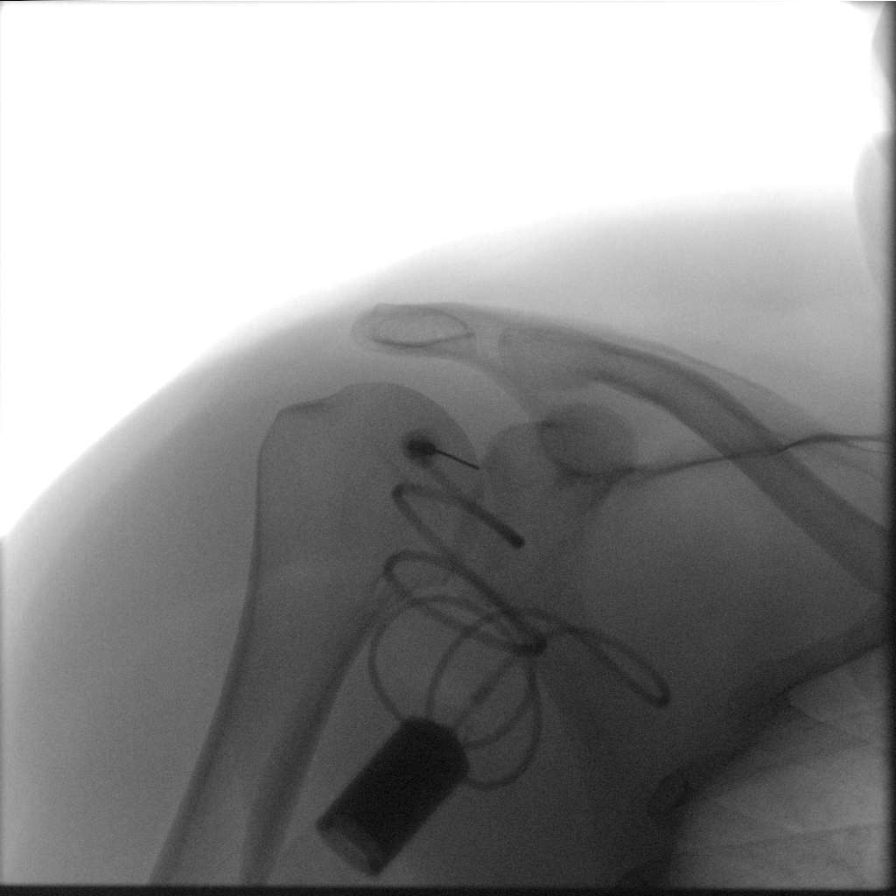

[Series 2: cp_standard · 0.18mm/px · 4 of 87 frames shown (2 of 2)]
[frame 14/87]
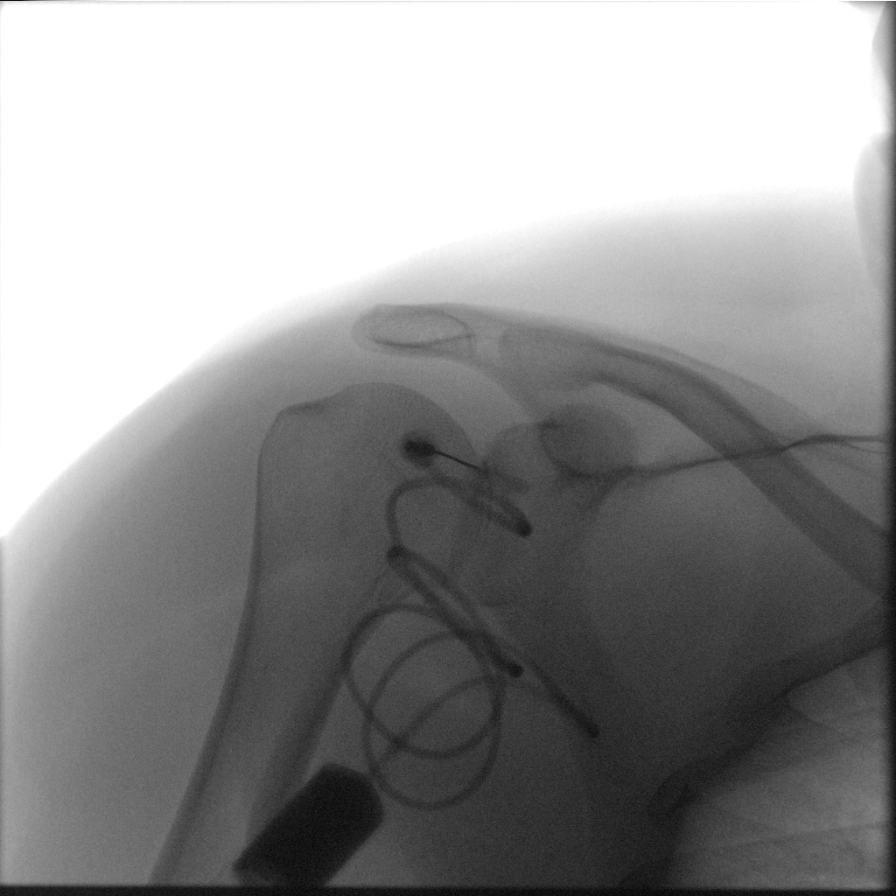
[frame 18/87]
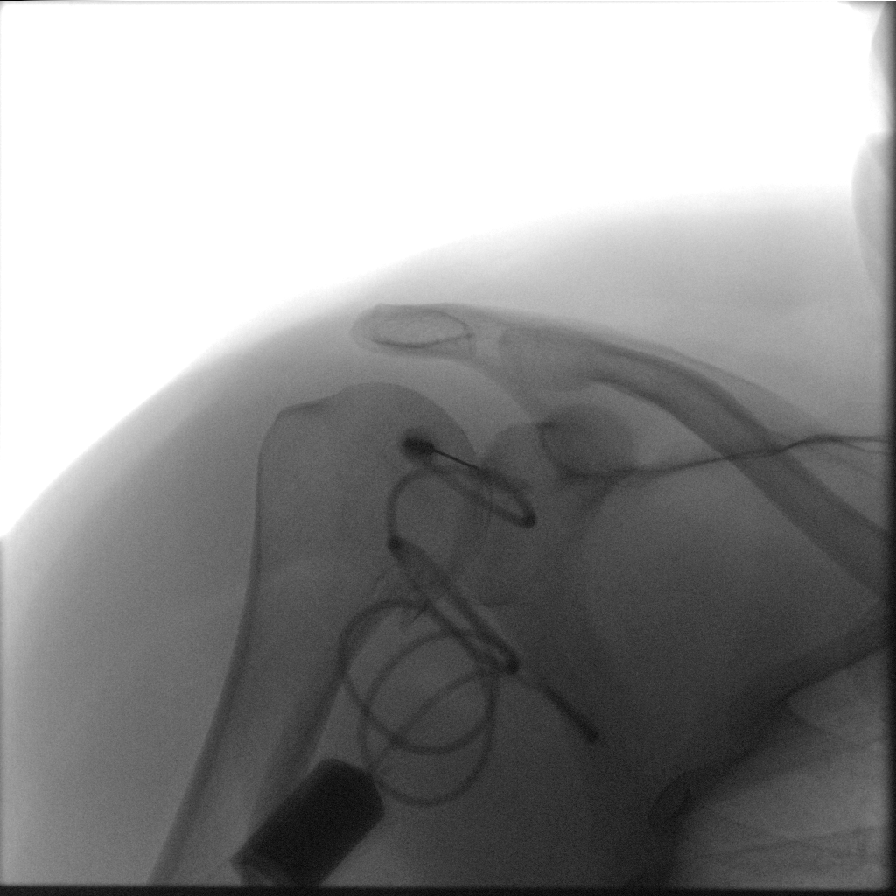
[frame 44/87]
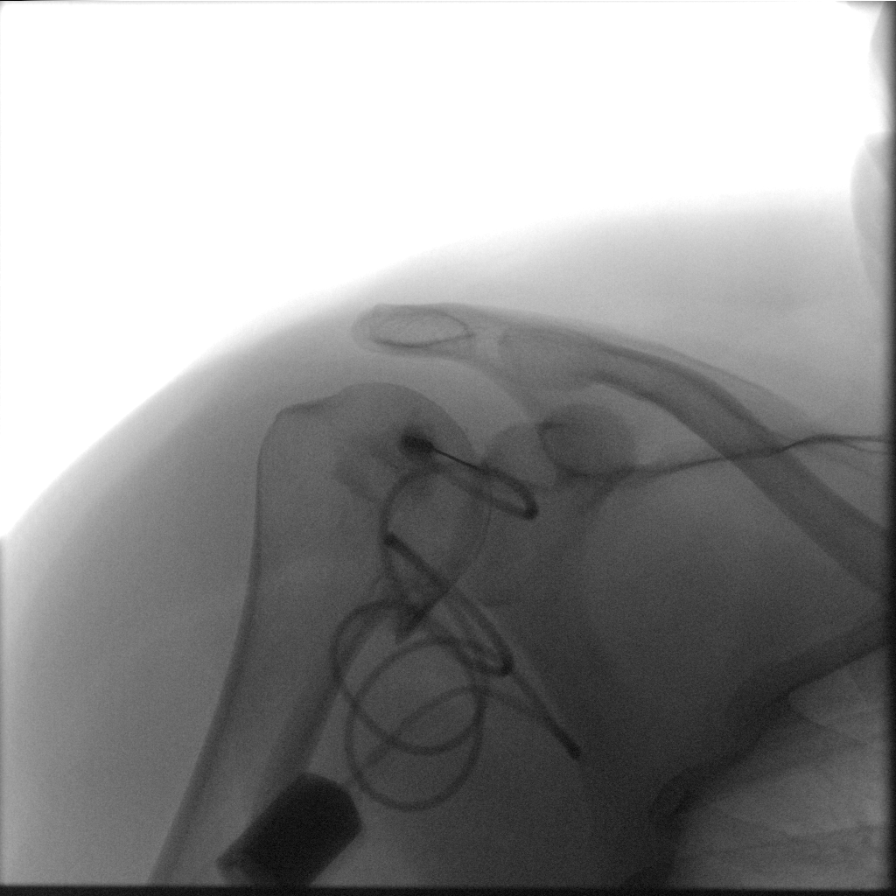
[frame 74/87]
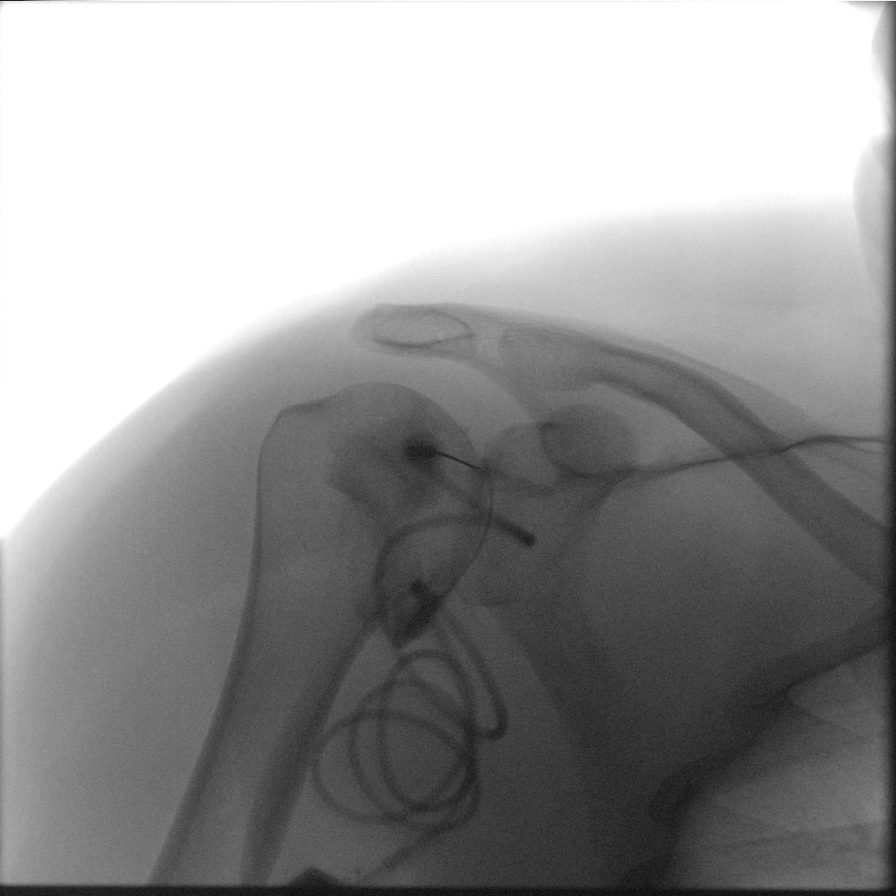

[Series 3: fluoro_iodine 2fps_bw · 0.18mm/px · 2 of 2 frames shown]
[frame 1/2]
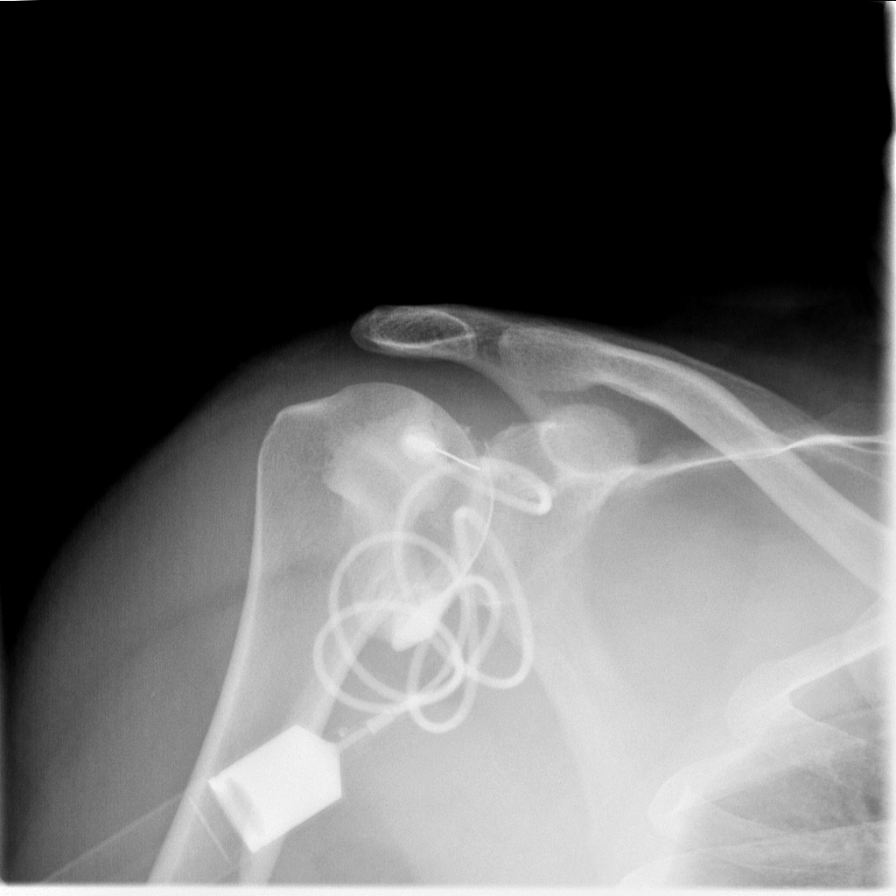
[frame 2/2]
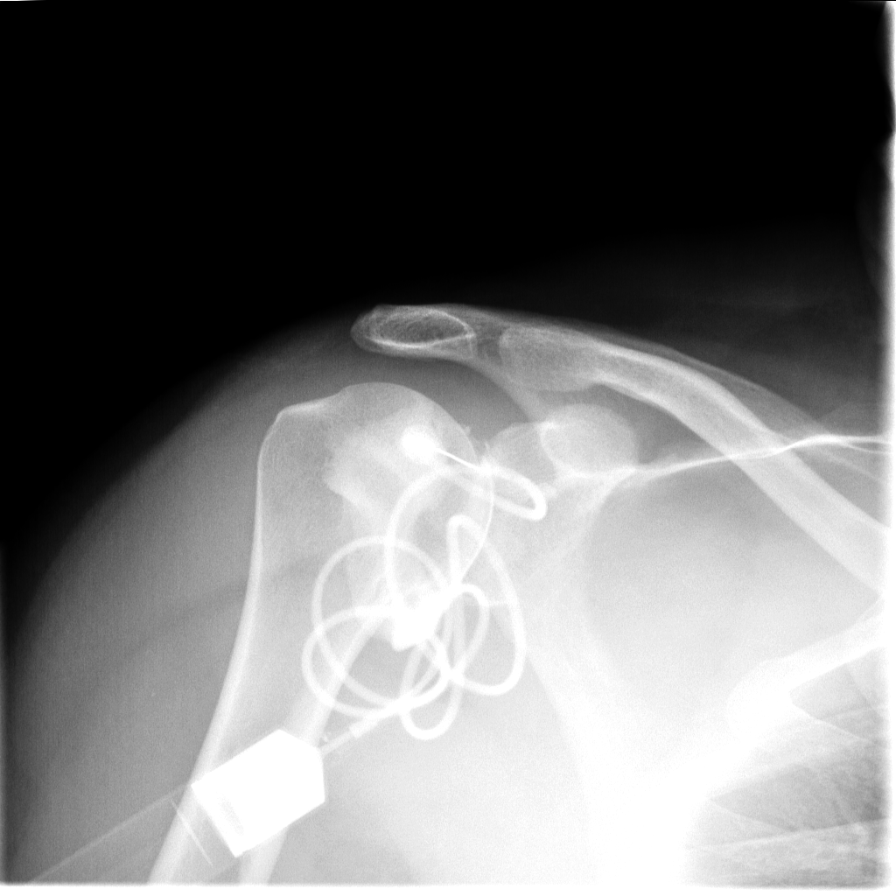

[7 of 7 positions shown; findings below may reference images not displayed]

FINDINGS: Opacification of the right shoulder capsule.
IMPRESSION: Technically successful right shoulder injection for MRI.
